# Patient Record
Sex: Female | Born: 1970 | Race: Black or African American | Hispanic: No | Marital: Married | Smoking: Never smoker
Health system: Southern US, Community
[De-identification: ages and names within clinical notes are randomized; demographics above are authoritative.]

## PROBLEM LIST (undated history)

## (undated) DIAGNOSIS — D649 Anemia, unspecified: Secondary | ICD-10-CM

## (undated) DIAGNOSIS — Z9889 Other specified postprocedural states: Secondary | ICD-10-CM

## (undated) DIAGNOSIS — Z800 Family history of malignant neoplasm of digestive organs: Secondary | ICD-10-CM

## (undated) HISTORY — PX: OTHER SURGICAL HISTORY: SHX169

## (undated) HISTORY — DX: Family history of malignant neoplasm of digestive organs: Z80.0

## (undated) HISTORY — DX: Anemia, unspecified: D64.9

## (undated) HISTORY — DX: Other specified postprocedural states: Z98.890

---

## 1995-04-03 HISTORY — PX: APPENDECTOMY: SHX54

## 1997-04-02 HISTORY — PX: EXPLORATORY LAPAROTOMY: SUR591

## 2006-09-28 ENCOUNTER — Emergency Department (HOSPITAL_COMMUNITY): Admission: EM | Admit: 2006-09-28 | Discharge: 2006-09-28 | Payer: Self-pay | Admitting: Family Medicine

## 2009-02-09 ENCOUNTER — Ambulatory Visit: Payer: Self-pay | Admitting: Family Medicine

## 2009-02-09 DIAGNOSIS — R030 Elevated blood-pressure reading, without diagnosis of hypertension: Secondary | ICD-10-CM | POA: Insufficient documentation

## 2009-02-10 DIAGNOSIS — E785 Hyperlipidemia, unspecified: Secondary | ICD-10-CM | POA: Insufficient documentation

## 2009-02-10 LAB — CONVERTED CEMR LAB
ALT: 11 units/L (ref 0–35)
AST: 19 units/L (ref 0–37)
Albumin: 4.2 g/dL (ref 3.5–5.2)
Alkaline Phosphatase: 66 units/L (ref 39–117)
BUN: 14 mg/dL (ref 6–23)
CO2: 22 meq/L (ref 19–32)
Calcium: 9.1 mg/dL (ref 8.4–10.5)
Chloride: 104 meq/L (ref 96–112)
Cholesterol, target level: 200 mg/dL
Cholesterol: 227 mg/dL — ABNORMAL HIGH (ref 0–200)
Creatinine, Ser: 0.81 mg/dL (ref 0.40–1.20)
Glucose, Bld: 82 mg/dL (ref 70–99)
HDL goal, serum: 40 mg/dL
HDL: 51 mg/dL (ref >39)
LDL Cholesterol: 162 mg/dL — ABNORMAL HIGH (ref 0–99)
LDL Goal: 160 mg/dL
Potassium: 3.9 meq/L (ref 3.5–5.3)
Sodium: 137 meq/L (ref 135–145)
TSH: 0.781 microintl units/mL (ref 0.350–4.500)
Total Bilirubin: 0.5 mg/dL (ref 0.3–1.2)
Total CHOL/HDL Ratio: 4.5
Total Protein: 7.7 g/dL (ref 6.0–8.3)
Triglycerides: 71 mg/dL (ref <150)
VLDL: 14 mg/dL (ref 0–40)

## 2009-02-15 ENCOUNTER — Encounter: Payer: Self-pay | Admitting: Family Medicine

## 2009-03-01 ENCOUNTER — Encounter: Payer: Self-pay | Admitting: Family Medicine

## 2009-03-01 DIAGNOSIS — Z9889 Other specified postprocedural states: Secondary | ICD-10-CM

## 2009-03-01 HISTORY — DX: Other specified postprocedural states: Z98.890

## 2009-04-12 ENCOUNTER — Encounter: Payer: Self-pay | Admitting: Family Medicine

## 2009-04-15 ENCOUNTER — Encounter: Payer: Self-pay | Admitting: Family Medicine

## 2009-04-15 DIAGNOSIS — K760 Fatty (change of) liver, not elsewhere classified: Secondary | ICD-10-CM | POA: Insufficient documentation

## 2009-04-18 ENCOUNTER — Encounter: Payer: Self-pay | Admitting: Family Medicine

## 2009-04-19 ENCOUNTER — Telehealth: Payer: Self-pay | Admitting: Family Medicine

## 2010-04-02 HISTORY — PX: COLONOSCOPY: SHX174

## 2010-05-03 NOTE — Consult Note (Signed)
Summary: [facility]  [facility]   Imported By: Lanelle Bal 02/22/2009 10:54:10  _____________________________________________________________________  External Attachment:    Type:   Image     Comment:   External Document

## 2010-05-03 NOTE — Letter (Signed)
Summary: Letter to Patient Regarding Lab Results/[facility]  Letter to Patient Regarding Lab Results/[facility]   Imported By: Lanelle Bal 05/05/2009 10:50:35  _____________________________________________________________________  External Attachment:    Type:   Image     Comment:   External Document

## 2010-05-03 NOTE — Miscellaneous (Signed)
Summary: Vaccine Record/[facility]  Vaccine Record/[facility]   Imported By: Lanelle Bal 04/15/2009 08:04:25  _____________________________________________________________________  External Attachment:    Type:   Image     Comment:   External Document

## 2010-05-03 NOTE — Letter (Signed)
Summary: [facility] 06-23-99 [facility]  [facility] 06-23-99 [facility]   Imported By: Lanelle Bal 04/15/2009 08:08:44  _____________________________________________________________________  External Attachment:    Type:   Image     Comment:   External Document

## 2010-05-03 NOTE — Consult Note (Signed)
Summary: [facility]  [facility]   Imported By: Lanelle Bal 04/21/2009 11:44:14  _____________________________________________________________________  External Attachment:    Type:   Image     Comment:   External Document

## 2010-05-03 NOTE — Letter (Signed)
Summary: Letter to Patient Regarding Abdominal US & Xray Results/[facility]  Letter to Patient Regarding Abdominal US & Xray Results/[facility]   Imported By: Lanelle Bal 04/26/2009 10:33:27  _____________________________________________________________________  External Attachment:    Type:   Image     Comment:   External Document

## 2010-05-03 NOTE — Miscellaneous (Signed)
Summary: Added fatty liver to prob list  Clinical Lists Changes  Problems: Removed problem of ABDOMINAL PAIN OTHER SPECIFIED SITE (ICD-789.09) Removed problem of HEALTH MAINTENANCE EXAM (ICD-V70.0) Added new problem of FATTY LIVER DISEASE (ICD-571.8)

## 2010-05-03 NOTE — Progress Notes (Signed)
Summary: Test results from another MD  Phone Note Call from Patient Call back at [phone]   Summary of Call: Patient called requesting test results. I see that we did receive her colon report which was normal and to repeat in 5 years. Per Dr. Linford Arnold it is ok to notify pt of the previous, but if she has questions she would have to call their office.  I will call patient to see if this is the results that she is referring to. Initial call taken by: Lucious Groves,  April 19, 2009 4:45 PM  Follow-up for Phone Call        Left message on voicemail to call back to office. Follow-up by: Lucious Groves,  April 19, 2009 4:45 PM

## 2011-08-02 ENCOUNTER — Encounter: Payer: Self-pay | Admitting: Family Medicine

## 2011-08-02 ENCOUNTER — Ambulatory Visit (INDEPENDENT_AMBULATORY_CARE_PROVIDER_SITE_OTHER): Payer: Federal, State, Local not specified - PPO | Admitting: Family Medicine

## 2011-08-02 VITALS — BP 123/84 | HR 80 | Wt 217.0 lb

## 2011-08-02 DIAGNOSIS — R5381 Other malaise: Secondary | ICD-10-CM

## 2011-08-02 DIAGNOSIS — Z23 Encounter for immunization: Secondary | ICD-10-CM

## 2011-08-02 DIAGNOSIS — R5383 Other fatigue: Secondary | ICD-10-CM

## 2011-08-02 DIAGNOSIS — Z0000 Encounter for general adult medical examination without abnormal findings: Secondary | ICD-10-CM

## 2011-08-02 DIAGNOSIS — N926 Irregular menstruation, unspecified: Secondary | ICD-10-CM

## 2011-08-02 MED ORDER — KETOCONAZOLE 2 % EX SHAM: MEDICATED_SHAMPOO | CUTANEOUS | Status: AC

## 2011-08-02 NOTE — Progress Notes (Signed)
Subjective:     Michele Roman is a 41 y.o. female and is here for a comprehensive physical exam. The patient reports no problems.  Having irrgular period for the past year.  Has been really tired the last few weeks. Says on light duty for a workers comp injury.  Says when gets up she feels really nauseted and want to lay down.  Says no GERD. No UTI sxs or fevers.    History   Social History  . Marital Status: Married    Spouse Name: N/A    Number of Children: 1  . Years of Education: N/A   Occupational History  . Not on file.   Social History Main Topics  . Smoking status: Never Smoker   . Smokeless tobacco: Not on file  . Alcohol Use: Yes     Rarely   . Drug Use: Not on file  . Sexually Active: Yes -- Female partner(s)   Other Topics Concern  . Not on file   Social History Narrative  . No narrative on file   Health Maintenance  Topic Date Due  . Pap Smear  11/16/1989  . Influenza Vaccine  01/01/2012  . Tetanus/tdap  12/22/2015    The following portions of the patient's history were reviewed and updated as appropriate: allergies, current medications, past family history, past medical history, past social history, past surgical history and problem list.  Review of Systems A comprehensive review of systems was negative.   Objective:    BP 123/84  Pulse 80  Wt 217 lb (98.431 kg) General appearance: alert, cooperative, appears stated age and mildly obese Head: Normocephalic, without obvious abnormality, atraumatic Eyes: conj clear, EOMi, PERRLA Ears: normal TM's and external ear canals both ears Nose: Nares normal. Septum midline. Mucosa normal. No drainage or sinus tenderness. Throat: lips, mucosa, and tongue normal; teeth and gums normal Neck: no adenopathy, no carotid bruit, no JVD, supple, symmetrical, trachea midline and thyroid not enlarged, symmetric, no tenderness/mass/nodules Back: symmetric, no curvature. ROM normal. No CVA tenderness. Lungs: clear to  auscultation bilaterally Heart: regular rate and rhythm, S1, S2 normal, no murmur, click, rub or gallop Abdomen: soft, non-tender; bowel sounds normal; no masses,  no organomegaly Extremities: extremities normal, atraumatic, no cyanosis or edema Pulses: 2+ and symmetric Skin: Skin color, texture, turgor normal. No rashes or lesions or tato, hypopigmented areas on her back.  Lymph nodes: Cervical, supraclavicular, and axillary nodes normal. Neurologic: Alert and oriented X 3, normal strength and tone. Normal symmetric reflexes. Normal coordination and gait    Assessment:    Healthy female exam.      Plan:     See After Visit Summary for Counseling Recommendations  Start a regular exercise program and make sure you are eating a healthy diet Try to eat 4 servings of dairy a day or take a calcium supplement (500mg  twice a day). Your vaccines are up to date.  We will call you with your lab results. If you don't here from [place] in about a week then please give [place] a call at [phone].  Fatigue we will check a CBC, TSH, vitamin D.  Irregular periods-likely related to her increase in stress recently we will check her hormones to make sure they're normal. She denies that anyone in her family have had premature menopause.  Tinea Versicolor - she tried using [employer] but then started to irritate her skin for that. We will change to ketoconazole shampoo and have her apply overnight and  wash off in the morning. If this is not helping her working and please let me know.  She wants to get vaccinated against hepatitis. First Twinrix injection given today. Repeat at one and 6 months.

## 2011-08-02 NOTE — Patient Instructions (Signed)
Start a regular exercise program and make sure you are eating a healthy diet Try to eat 4 servings of dairy a day or take a calcium supplement (500mg twice a day). Your vaccines are up to date.   

## 2011-08-03 LAB — COMPLETE METABOLIC PANEL WITH GFR
ALT: 16 U/L (ref 0–35)
AST: 20 U/L (ref 0–37)
Albumin: 3.9 g/dL (ref 3.5–5.2)
Alkaline Phosphatase: 53 U/L (ref 39–117)
BUN: 10 mg/dL (ref 6–23)
CO2: 22 mEq/L (ref 19–32)
Calcium: 9 mg/dL (ref 8.4–10.5)
Chloride: 105 mEq/L (ref 96–112)
Creat: 0.79 mg/dL (ref 0.50–1.10)
GFR, Est African American: >89 mL/min
GFR, Est Non African American: >89 mL/min
Glucose, Bld: 91 mg/dL (ref 70–99)
Potassium: 4.1 mEq/L (ref 3.5–5.3)
Sodium: 137 mEq/L (ref 135–145)
Total Bilirubin: 0.4 mg/dL (ref 0.3–1.2)
Total Protein: 6.9 g/dL (ref 6.0–8.3)

## 2011-08-03 LAB — CBC WITH DIFFERENTIAL/PLATELET
Basophils Absolute: 0 10*3/uL (ref 0.0–0.1)
Basophils Relative: 0 % (ref 0–1)
Eosinophils Absolute: 0 10*3/uL (ref 0.0–0.7)
Eosinophils Relative: 1 % (ref 0–5)
HCT: 36.6 % (ref 36.0–46.0)
Hemoglobin: 11.4 g/dL — ABNORMAL LOW (ref 12.0–15.0)
Lymphocytes Relative: 31 % (ref 12–46)
Lymphs Abs: 1.9 10*3/uL (ref 0.7–4.0)
MCH: 21.7 pg — ABNORMAL LOW (ref 26.0–34.0)
MCHC: 31.1 g/dL (ref 30.0–36.0)
MCV: 69.7 fL — ABNORMAL LOW (ref 78.0–100.0)
Monocytes Absolute: 0.4 10*3/uL (ref 0.1–1.0)
Monocytes Relative: 6 % (ref 3–12)
Neutro Abs: 3.8 10*3/uL (ref 1.7–7.7)
Neutrophils Relative %: 62 % (ref 43–77)
Platelets: 241 10*3/uL (ref 150–400)
RBC: 5.25 MIL/uL — ABNORMAL HIGH (ref 3.87–5.11)
RDW: 18 % — ABNORMAL HIGH (ref 11.5–15.5)
WBC: 6.2 10*3/uL (ref 4.0–10.5)

## 2011-08-03 LAB — LIPID PANEL
Cholesterol: 205 mg/dL — ABNORMAL HIGH (ref 0–200)
HDL: 47 mg/dL (ref >39)
LDL Cholesterol: 140 mg/dL — ABNORMAL HIGH (ref 0–99)
Total CHOL/HDL Ratio: 4.4 Ratio
Triglycerides: 88 mg/dL (ref <150)
VLDL: 18 mg/dL (ref 0–40)

## 2011-08-03 LAB — LUTEINIZING HORMONE: LH: <0.1 m[IU]/mL

## 2011-08-03 LAB — TSH: TSH: 1.082 u[IU]/mL (ref 0.350–4.500)

## 2011-08-03 LAB — PROGESTERONE: Progesterone: 10.9 ng/mL

## 2011-08-03 LAB — ESTRADIOL: Estradiol: 594.2 pg/mL

## 2011-08-03 LAB — FOLLICLE STIMULATING HORMONE: FSH: <0.3 m[IU]/mL

## 2011-08-04 LAB — VITAMIN D 25 HYDROXY (VIT D DEFICIENCY, FRACTURES): Vit D, 25-Hydroxy: 42 ng/mL (ref 30–89)

## 2011-08-07 ENCOUNTER — Ambulatory Visit (INDEPENDENT_AMBULATORY_CARE_PROVIDER_SITE_OTHER): Payer: Federal, State, Local not specified - PPO | Admitting: Family Medicine

## 2011-08-07 VITALS — BP 125/86 | HR 86

## 2011-08-07 DIAGNOSIS — Z331 Pregnant state, incidental: Secondary | ICD-10-CM

## 2011-08-07 DIAGNOSIS — Z205 Contact with and (suspected) exposure to viral hepatitis: Secondary | ICD-10-CM

## 2011-08-07 DIAGNOSIS — N912 Amenorrhea, unspecified: Secondary | ICD-10-CM

## 2011-08-07 DIAGNOSIS — R110 Nausea: Secondary | ICD-10-CM

## 2011-08-07 LAB — POCT URINE PREGNANCY: Preg Test, Ur: POSITIVE

## 2011-08-07 MED ORDER — PRENATAL VITAMINS PLUS 27-1 MG PO TABS: 1.0000 | ORAL_TABLET | Freq: Every day | ORAL | Status: DC

## 2011-08-07 NOTE — Progress Notes (Signed)
  Subjective:    Patient ID: Michele Roman, female    DOB: 1970-05-04, 41 y.o.   MRN: [id number] UPT- call Cell number for any reason per pt request. HPI    Review of Systems     Objective:   Physical Exam        Assessment & Plan:  Hormones were abnormal on blood work. Asked to come in today for urine pregnancy test. It was positive. She was surprised by the results. She said she did have a period in mid February, she doesn't know the exact date. She did spot for one day in the middle of March but wasn't sure if it was really a fall.. She does history of irregular periods. We will need to start her on prenatal vitamins I sent a prescription to her pharmacy. We'll also need to get her in with an OB/GYN. Check [facility] has an appointment scheduled for May 21 about 2 weeks. Asked her to call them so that they can be aware that she is now pregnant. We'll go ahead and schedule her for a OB ultrasound less than 14 weeks to see if we can date the pregnancy since her dates may be off as much as 4 weeks. She is either about 7-1/[redacted] weeks pregnant or 11-1/[redacted] weeks pregnant.  She is also concerned because she had had to enlarge and could possibly be pregnant at that time. We will check an acute hepatitis panel. She also received her first Twinrix injection last week. I will need to check on the safety indication for pregnancy for that. Studies show no adverse fetal effects in animals but there've been no controlled human studies. We will hold off on continuing the vaccination during her pregnancy.  Cipriano Bunker, MD

## 2011-08-08 LAB — HEPATITIS PANEL, ACUTE
HCV Ab: NEGATIVE
Hep A IgM: NEGATIVE
Hep B C IgM: NEGATIVE
Hepatitis B Surface Ag: NEGATIVE

## 2011-08-10 ENCOUNTER — Encounter (HOSPITAL_COMMUNITY): Payer: Self-pay

## 2011-08-10 ENCOUNTER — Ambulatory Visit (HOSPITAL_COMMUNITY)
Admission: RE | Admit: 2011-08-10 | Discharge: 2011-08-10 | Disposition: A | Discharge status: Home / Self Care | Payer: Federal, State, Local not specified - PPO | Source: Ambulatory Visit | Attending: Family Medicine | Admitting: Family Medicine

## 2011-08-10 ENCOUNTER — Other Ambulatory Visit: Payer: Self-pay | Admitting: Family Medicine

## 2011-08-10 DIAGNOSIS — Z331 Pregnant state, incidental: Secondary | ICD-10-CM

## 2011-08-10 DIAGNOSIS — Z3689 Encounter for other specified antenatal screening: Secondary | ICD-10-CM | POA: Insufficient documentation

## 2011-08-10 DIAGNOSIS — O09529 Supervision of elderly multigravida, unspecified trimester: Secondary | ICD-10-CM | POA: Insufficient documentation

## 2011-08-10 DIAGNOSIS — O34219 Maternal care for unspecified type scar from previous cesarean delivery: Secondary | ICD-10-CM | POA: Insufficient documentation

## 2012-11-03 ENCOUNTER — Encounter: Payer: Federal, State, Local not specified - PPO | Admitting: Family Medicine

## 2012-11-12 ENCOUNTER — Ambulatory Visit (INDEPENDENT_AMBULATORY_CARE_PROVIDER_SITE_OTHER): Payer: Federal, State, Local not specified - PPO | Admitting: Family Medicine

## 2012-11-12 ENCOUNTER — Encounter: Payer: Self-pay | Admitting: Family Medicine

## 2012-11-12 VITALS — BP 131/88 | HR 71 | Ht 64.0 in | Wt 220.0 lb

## 2012-11-12 DIAGNOSIS — R5381 Other malaise: Secondary | ICD-10-CM

## 2012-11-12 DIAGNOSIS — R5383 Other fatigue: Secondary | ICD-10-CM

## 2012-11-12 DIAGNOSIS — Z0000 Encounter for general adult medical examination without abnormal findings: Secondary | ICD-10-CM

## 2012-11-12 DIAGNOSIS — Z23 Encounter for immunization: Secondary | ICD-10-CM

## 2012-11-12 LAB — LIPID PANEL
Cholesterol: 213 mg/dL — ABNORMAL HIGH (ref 0–200)
HDL: 44 mg/dL (ref >39)
LDL Cholesterol: 151 mg/dL — ABNORMAL HIGH (ref 0–99)
Total CHOL/HDL Ratio: 4.8 Ratio
Triglycerides: 90 mg/dL (ref <150)
VLDL: 18 mg/dL (ref 0–40)

## 2012-11-12 LAB — COMPLETE METABOLIC PANEL WITH GFR
ALT: 12 U/L (ref 0–35)
AST: 19 U/L (ref 0–37)
Albumin: 4.1 g/dL (ref 3.5–5.2)
Alkaline Phosphatase: 78 U/L (ref 39–117)
BUN: 12 mg/dL (ref 6–23)
CO2: 22 mEq/L (ref 19–32)
Calcium: 9.3 mg/dL (ref 8.4–10.5)
Chloride: 105 mEq/L (ref 96–112)
Creat: 0.81 mg/dL (ref 0.50–1.10)
GFR, Est African American: >89 mL/min
GFR, Est Non African American: >89 mL/min
Glucose, Bld: 83 mg/dL (ref 70–99)
Potassium: 3.8 mEq/L (ref 3.5–5.3)
Sodium: 138 mEq/L (ref 135–145)
Total Bilirubin: 0.4 mg/dL (ref 0.3–1.2)
Total Protein: 7.7 g/dL (ref 6.0–8.3)

## 2012-11-12 LAB — CBC
HCT: 38.8 % (ref 36.0–46.0)
Hemoglobin: 13.1 g/dL (ref 12.0–15.0)
MCH: 23.4 pg — ABNORMAL LOW (ref 26.0–34.0)
MCHC: 33.8 g/dL (ref 30.0–36.0)
MCV: 69.2 fL — ABNORMAL LOW (ref 78.0–100.0)
Platelets: 262 10*3/uL (ref 150–400)
RBC: 5.61 MIL/uL — ABNORMAL HIGH (ref 3.87–5.11)
RDW: 16 % — ABNORMAL HIGH (ref 11.5–15.5)
WBC: 7.6 10*3/uL (ref 4.0–10.5)

## 2012-11-12 LAB — TSH: TSH: 0.799 u[IU]/mL (ref 0.350–4.500)

## 2012-11-12 LAB — FERRITIN: Ferritin: 8 ng/mL — ABNORMAL LOW (ref 10–291)

## 2012-11-12 LAB — VITAMIN B12: Vitamin B-12: 686 pg/mL (ref 211–911)

## 2012-11-12 LAB — FOLATE: Folate: >20 ng/mL

## 2012-11-12 MED ORDER — PRENATAL VITAMINS PLUS 27-1 MG PO TABS: 1.0000 | ORAL_TABLET | Freq: Every day | ORAL | Status: DC

## 2012-11-12 NOTE — Patient Instructions (Addendum)
Keep up a regular exercise program and make sure you are eating a healthy diet Try to eat 4 servings of dairy a day, or if you are lactose intolerant take a calcium with vitamin D daily.  Your vaccines are up to date.   

## 2012-11-12 NOTE — Progress Notes (Signed)
Called pt's ob/gyn Colette Ribas [phone] and spoke with Burna Mortimer she will fax over pt's last OV notes.Loralee Pacas [place]

## 2012-11-12 NOTE — Progress Notes (Signed)
Subjective:     Michele Roman is a 42 y.o. female and is here for a comprehensive physical exam. The patient reports no problems.  This goes to OB/GYN for her Pap smears and mammogram. She has felt more fatigued for the last 4 months and stopping her prenatal vitamin. She had her baby back in December and everything went well. She is frustrated with her weight and notices that her blood pressures up a little bit for her today.  History   Social History  . Marital Status: Married    Spouse Name: Matt    Number of Children: 1  . Years of Education: N/A   Occupational History  . Mailhandler     [employer]   Social History Main Topics  . Smoking status: Never Smoker   . Smokeless tobacco: Not on file  . Alcohol Use: Yes     Comment: Rarely   . Drug Use: Not on file  . Sexual Activity: Yes    Partners: Male   Other Topics Concern  . 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    Health Maintenance  Topic Date Due  . Pap Smear  11/16/1989  . Influenza Vaccine  12/01/2012  . Tetanus/tdap  12/22/2015    The following portions of the patient's history were reviewed and updated as appropriate: allergies, current medications, past family history, past medical history, past social history, past surgical history and problem list.  Review of Systems A comprehensive review of systems was negative.   Objective:    BP 131/88  Pulse 71  Ht 5\' 4"  (1.626 m)  Wt 220 lb (99.791 kg)  BMI 37.74 kg/m2  LMP 10/29/2012  Breastfeeding? Unknown General appearance: alert, cooperative and appears stated age Head: Normocephalic, without obvious abnormality, atraumatic Eyes: conj clear, EOMi, PEERLA Ears: normal TM's and external ear canals both ears Nose: Nares normal. Septum midline. Mucosa normal. No drainage or sinus tenderness. Throat: lips, mucosa, and tongue normal; teeth and gums normal Neck: no adenopathy, no carotid bruit, no JVD, supple,  symmetrical, trachea midline and thyroid not enlarged, symmetric, no tenderness/mass/nodules Back: symmetric, no curvature. ROM normal. No CVA tenderness. Lungs: clear to auscultation bilaterally Heart: regular rate and rhythm, S1, S2 normal, no murmur, click, rub or gallop Abdomen: soft, non-tender; bowel sounds normal; no masses,  no organomegaly Extremities: extremities normal, atraumatic, no cyanosis or edema Pulses: 2+ and symmetric Skin: Skin color, texture, turgor normal. No rashes or lesions Lymph nodes: Cervical and supraclavicular nodes are normal Neurologic: Alert and oriented X 3, normal strength and tone. Normal symmetric reflexes. Normal coordination and gait    Assessment:    Healthy female exam.      Plan:     Keep up a regular exercise program and make sure you are eating a healthy diet Try to eat 4 servings of dairy a day, or if you are lactose intolerant take a calcium with vitamin D daily.  Your vaccines are up to date.   See After Visit Summary for Counseling Recommendations  Keep up a regular exercise program and make sure you are eating a healthy diet Try to eat 4 servings of dairy a day, or if you are lactose intolerant take a calcium with vitamin D daily.  Your vaccines are up to date.   History of abnormal Pap smear. Has followup in about a month with her OB/GYN. Encouraged her to discuss whether not she  needs a mammogram with her OB/GYN since she is now 42 years old.  Fatigue-she says exertion she stopped her prenatal vitamin about 4 months ago she has felt much more fatigued than usual. She says occasionally her periods are heavy but not consistently heavy. We'll check a thyroid level since this is certainly a possibility postpartum and check a CBC, ferritin, folate, B12 for possible causes of anemia.

## 2012-11-13 ENCOUNTER — Encounter: Payer: Self-pay | Admitting: Family Medicine

## 2012-11-13 DIAGNOSIS — IMO0002 Reserved for concepts with insufficient information to code with codable children: Secondary | ICD-10-CM | POA: Insufficient documentation

## 2012-11-13 LAB — VITAMIN D 25 HYDROXY (VIT D DEFICIENCY, FRACTURES): Vit D, 25-Hydroxy: 42 ng/mL (ref 30–89)

## 2013-01-14 ENCOUNTER — Ambulatory Visit (INDEPENDENT_AMBULATORY_CARE_PROVIDER_SITE_OTHER): Payer: Federal, State, Local not specified - PPO | Admitting: Family Medicine

## 2013-01-14 ENCOUNTER — Encounter: Payer: Self-pay | Admitting: Family Medicine

## 2013-01-14 VITALS — BP 131/87 | HR 67 | Wt 226.0 lb

## 2013-01-14 DIAGNOSIS — E669 Obesity, unspecified: Secondary | ICD-10-CM

## 2013-01-14 DIAGNOSIS — D509 Iron deficiency anemia, unspecified: Secondary | ICD-10-CM

## 2013-01-14 LAB — FERRITIN: Ferritin: 14 ng/mL (ref 10–291)

## 2013-01-14 LAB — RETICULOCYTES
ABS Retic: 101.5 10*3/uL (ref 19.0–186.0)
RBC.: 5.64 MIL/uL — ABNORMAL HIGH (ref 3.87–5.11)
Retic Ct Pct: 1.8 % (ref 0.4–2.3)

## 2013-01-14 LAB — HEMOGLOBIN: Hemoglobin: 13.2 g/dL (ref 12.0–15.0)

## 2013-01-14 NOTE — Patient Instructions (Signed)
Return in about 5 months (around 04/14/2013) for 3rd twinrix injection w/ nurse.

## 2013-01-14 NOTE — Progress Notes (Signed)
  Subjective:    Patient ID: Michele Roman, female    DOB: [birth year], 42 y.o.   MRN: [id number]  HPI Here for followup on iron deficiency. She was here for a routine physical in August, approximately 2 months ago. It was noted that she had low iron stores and recommended starting on over-the-counter iron supplement. Back on PNV. No constipation. Not using the orange juice. She feels better overall.  Energy is better overall. Sleep is ok. Joined a gym about a month ago but had to stop bc was getting shin pain. She sees [facility]  Obesity - has gained 6 lbs in the last 2 months. She is really struggling. Often skips meals in the AM  Hasn't been abel to exercise as above. She would like to discuss some strategies for weight loss.  Review of Systems     Objective:   Physical Exam  Constitutional: She is oriented to person, place, and time. She appears well-developed and well-nourished.  HENT:  Head: Normocephalic and atraumatic.  Neurological: She is alert and oriented to person, place, and time.  Psychiatric: She has a normal mood and affect.          Assessment & Plan:  Fatigue - improving.  We'll recheck ferritin, reticulocyte count, hemoglobin today. She's having a good response. I did encourage her to try taking a prenatal vitamin with a couple ounces of orange juice for improved absorption of the iron content.  Obesity/BMI 38-discussed strategies for weight loss. I recommended the Smart phone application call my fitness PAL. She often skips breakfast and we discussed making sure that she get some type of breakfast even if it's a little more carb heavy. Raeanne Gathers also great source of protein for breakfast.Also discussed weight loss medications - pros and cons. I strong encouraged her to followup with her orthopedist as well for her foot and leg pain. It's important to get her back into exercise for her to be successful at weight loss.

## 2013-01-15 ENCOUNTER — Other Ambulatory Visit: Payer: Self-pay | Admitting: *Deleted

## 2013-01-15 DIAGNOSIS — D649 Anemia, unspecified: Secondary | ICD-10-CM

## 2013-06-25 ENCOUNTER — Encounter: Payer: Self-pay | Admitting: Family Medicine

## 2013-06-25 ENCOUNTER — Ambulatory Visit (INDEPENDENT_AMBULATORY_CARE_PROVIDER_SITE_OTHER): Payer: 59 | Admitting: Family Medicine

## 2013-06-25 VITALS — BP 114/79 | HR 67 | Wt 217.0 lb

## 2013-06-25 DIAGNOSIS — Z23 Encounter for immunization: Secondary | ICD-10-CM

## 2013-06-25 DIAGNOSIS — D509 Iron deficiency anemia, unspecified: Secondary | ICD-10-CM

## 2013-06-25 DIAGNOSIS — Z0000 Encounter for general adult medical examination without abnormal findings: Secondary | ICD-10-CM

## 2013-06-25 LAB — CBC
HCT: 39.1 % (ref 36.0–46.0)
Hemoglobin: 13.1 g/dL (ref 12.0–15.0)
MCH: 23.8 pg — ABNORMAL LOW (ref 26.0–34.0)
MCHC: 33.5 g/dL (ref 30.0–36.0)
MCV: 71 fL — ABNORMAL LOW (ref 78.0–100.0)
Platelets: 266 10*3/uL (ref 150–400)
RBC: 5.51 MIL/uL — ABNORMAL HIGH (ref 3.87–5.11)
RDW: 15.7 % — ABNORMAL HIGH (ref 11.5–15.5)
WBC: 6.7 10*3/uL (ref 4.0–10.5)

## 2013-06-25 LAB — COMPLETE METABOLIC PANEL WITH GFR
ALT: 11 U/L (ref 0–35)
AST: 17 U/L (ref 0–37)
Albumin: 3.9 g/dL (ref 3.5–5.2)
Alkaline Phosphatase: 62 U/L (ref 39–117)
BUN: 16 mg/dL (ref 6–23)
CO2: 22 mEq/L (ref 19–32)
Calcium: 9.2 mg/dL (ref 8.4–10.5)
Chloride: 107 mEq/L (ref 96–112)
Creat: 0.82 mg/dL (ref 0.50–1.10)
GFR, Est African American: >89 mL/min
GFR, Est Non African American: 89 mL/min
Glucose, Bld: 86 mg/dL (ref 70–99)
Potassium: 4 mEq/L (ref 3.5–5.3)
Sodium: 138 mEq/L (ref 135–145)
Total Bilirubin: 0.4 mg/dL (ref 0.2–1.2)
Total Protein: 7.2 g/dL (ref 6.0–8.3)

## 2013-06-25 LAB — LIPID PANEL
Cholesterol: 178 mg/dL (ref 0–200)
HDL: 37 mg/dL — ABNORMAL LOW (ref >39)
LDL Cholesterol: 129 mg/dL — ABNORMAL HIGH (ref 0–99)
Total CHOL/HDL Ratio: 4.8 Ratio
Triglycerides: 61 mg/dL (ref <150)
VLDL: 12 mg/dL (ref 0–40)

## 2013-06-25 LAB — FERRITIN: Ferritin: 14 ng/mL (ref 10–291)

## 2013-06-25 MED ORDER — KETOCONAZOLE 2 % EX CREA: 1.0000 "application " | TOPICAL_CREAM | Freq: Every day | CUTANEOUS | Status: DC

## 2013-06-25 NOTE — Patient Instructions (Signed)
Keep up a regular exercise program and make sure you are eating a healthy diet Try to eat 4 servings of dairy a day, or if you are lactose intolerant take a calcium with vitamin D daily.  Your vaccines are up to date.   

## 2013-06-25 NOTE — Progress Notes (Signed)
  Subjective:     Michele Roman is a 43 y.o. female and is here for a comprehensive physical exam. The patient reports problems - noticed pigmentation chage around neck. Thinks sweating triggers it. Doesn't really bother her.  History   Social History  . Marital Status: Married    Spouse Name: Matt    Number of Children: 1  . Years of Education: N/A   Occupational History  . [place] History Main Topics  . Smoking status: Never Smoker   . Smokeless tobacco: Not on file  . Alcohol Use: Yes     Comment: Rarely   . Drug Use: Not on file  . Sexual Activity: Yes    Partners: Male   Other Topics Concern  . 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    Health Maintenance  Topic Date Due  . Influenza Vaccine  04/01/2014 (Originally 10/31/2012)  . Pap Smear  07/02/2015  . Tetanus/tdap  12/22/2015    The following portions of the patient's history were reviewed and updated as appropriate: allergies, current medications, past family history, past medical history, past social history, past surgical history and problem list.  Review of Systems A comprehensive review of systems was negative.   Objective:    BP 114/79  Pulse 67  Wt 217 lb (98.431 kg)  SpO2 98% General appearance: alert, cooperative and appears stated age Head: Normocephalic, without obvious abnormality, atraumatic Eyes: conj clear, EOMI, PEERLA Ears: normal TM's and external ear canals both ears Nose: Nares normal. Septum midline. Mucosa normal. No drainage or sinus tenderness. Throat: lips, mucosa, and tongue normal; teeth and gums normal Neck: no adenopathy, no carotid bruit, no JVD, supple, symmetrical, trachea midline and thyroid not enlarged, symmetric, no tenderness/mass/nodules Back: symmetric, no curvature. ROM normal. No CVA tenderness. Lungs: clear to auscultation bilaterally Heart: regular rate and rhythm, S1, S2 normal, no murmur,  click, rub or gallop Abdomen: soft, non-tender; bowel sounds normal; no masses,  no organomegaly Extremities: extremities normal, atraumatic, no cyanosis or edema Pulses: 2+ and symmetric Skin: Macular hypopigmented rash around the neck. It is circular formation. Lymph nodes: Cervical, supraclavicular, and axillary nodes normal. Neurologic: Alert and oriented X 3, normal strength and tone. Normal symmetric reflexes. Normal coordination and gait    Assessment:    Healthy female exam.     Plan:     See After Visit Summary for Counseling Recommendations  Keep up a regular exercise program and make sure you are eating a healthy diet Try to eat 4 servings of dairy a day, or if you are lactose intolerant take a calcium with vitamin D daily.  Your vaccines are up to date.   Tinea versicolor - we'll treat with topical ketoconazole cream. She will need to treat for least 2 weeks. It may take a little longer for the pigmentation to return.

## 2013-06-25 NOTE — Addendum Note (Signed)
Addended by: Teddy Spike on: 06/25/2013 10:05 AM   Modules accepted: Orders

## 2013-10-08 ENCOUNTER — Ambulatory Visit (INDEPENDENT_AMBULATORY_CARE_PROVIDER_SITE_OTHER): Payer: 59 | Admitting: Family Medicine

## 2013-10-08 ENCOUNTER — Encounter: Payer: Self-pay | Admitting: Family Medicine

## 2013-10-08 VITALS — BP 125/94 | HR 85 | Temp 98.6°F | Wt 217.0 lb

## 2013-10-08 DIAGNOSIS — J309 Allergic rhinitis, unspecified: Secondary | ICD-10-CM

## 2013-10-08 MED ORDER — BECLOMETHASONE DIPROPIONATE 80 MCG/ACT NA AERS: INHALATION_SPRAY | NASAL | Status: DC

## 2013-10-08 NOTE — Progress Notes (Signed)
CC: Michele Roman is a 43 y.o. female is here for Sore Throat   Subjective: HPI:  Complains of nasal congestion, sore throat, drainage down the back of her throat has been present on a daily basis since February. Symptoms fluctuate from mild to moderate severity on a day-to-day basis. Symptoms are worse when she is in dusty environments such as at her work. Symptoms are improved with Mucinex, nothing else has seemed to make better or worse and there been no other interventions. She denies sneezing, facial pain, headache, fevers, chills, chest pain, shortness of breath, wheezing or cough   Review Of Systems Outlined In HPI  Past Medical History  Diagnosis Date  . H/O colonoscopy 03/01/09    Past Surgical History  Procedure Laterality Date  . Cesarean section  1999  . Appendectomy  1997  . Exploratory laparotomy  1999  . Right foot surgery  2008, 2011    hardware in the foot   Family History  Problem Relation Age of Onset  . Hypertension Mother     History   Social History  . Marital Status: Married    Spouse Name: Matt    Number of Children: 1  . Years of Education: N/A   Occupational History  . [place] History Main Topics  . Smoking status: Never Smoker   . Smokeless tobacco: Not on file  . Alcohol Use: Yes     Comment: Rarely   . Drug Use: Not on file  . Sexual Activity: Yes    Partners: Male   Other Topics Concern  . 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      Objective: BP 125/94  Pulse 85  Temp(Src) 98.6 F (37 C) (Oral)  Wt 217 lb (98.431 kg)  General: Alert and Oriented, No Acute Distress HEENT: Pupils equal, round, reactive to light. Conjunctivae clear.  External ears unremarkable, canals clear with intact TMs with appropriate landmarks.  Middle ear appears open without effusion. Pink inferior turbinates.  Moist mucous membranes, pharynx without inflammation nor lesions.  Neck  supple without palpable lymphadenopathy nor abnormal masses. Lungs: Clear to auscultation bilaterally, no wheezing/ronchi/rales.  Comfortable work of breathing. Good air movement. Cardiac: Regular rate and rhythm. Normal S1/S2.  No murmurs, rubs, nor gallops.   Mental Status: No depression, anxiety, nor agitation. Skin: Warm and dry.  Assessment & Plan: Marycruz was seen today for sore throat.  Diagnoses and associated orders for this visit:  Allergic sinusitis    Allergic sinusitis: Start qnasl, followup with PCP next week if no improvement by the end of the week, however if this controls symptoms I've advised her to consider switching to Nasonex or Flonase which is over-the-counter  Return if symptoms worsen or fail to improve.

## 2013-10-12 ENCOUNTER — Telehealth: Payer: Self-pay | Admitting: *Deleted

## 2013-10-12 ENCOUNTER — Encounter: Payer: Self-pay | Admitting: Emergency Medicine

## 2013-10-12 ENCOUNTER — Emergency Department
Admission: EM | Admit: 2013-10-12 | Discharge: 2013-10-12 | Disposition: A | Discharge status: Home / Self Care | Payer: 59 | Source: Home / Self Care | Attending: Family Medicine | Admitting: Family Medicine

## 2013-10-12 DIAGNOSIS — J029 Acute pharyngitis, unspecified: Secondary | ICD-10-CM

## 2013-10-12 DIAGNOSIS — K219 Gastro-esophageal reflux disease without esophagitis: Secondary | ICD-10-CM

## 2013-10-12 DIAGNOSIS — J309 Allergic rhinitis, unspecified: Secondary | ICD-10-CM

## 2013-10-12 LAB — POCT RAPID STREP A (OFFICE): Rapid Strep A Screen: NEGATIVE

## 2013-10-12 MED ORDER — OMEPRAZOLE 20 MG PO CPDR: DELAYED_RELEASE_CAPSULE | ORAL | Status: DC

## 2013-10-12 NOTE — Discharge Instructions (Signed)
Recommend a trial of Zyrtec once daily for allergy symptoms.  Continue nasal inhaler.  If cold symptoms develop: Take plain Mucinex (1200 mg guaifenesin) twice daily for cough and congestion.  May add Sudafed for sinus congestion.   Increase fluid intake, rest. May use Afrin nasal spray (or generic oxymetazoline) twice daily for about 5 days.  Also recommend using saline nasal spray several times daily and saline nasal irrigation (AYR is a common brand) Try warm salt water gargles for sore throat.  May take Delsym Cough Suppressant at bedtime for nighttime cough.  Follow-up with family doctor if not improving 7 to 10 days.

## 2013-10-12 NOTE — ED Provider Notes (Signed)
CSN: [id number]     Arrival date & time 10/12/13  1304 History   First MD Initiated Contact with Patient 10/12/13 1339     Chief Complaint  Patient presents with  . Sore Throat  . Nasal Congestion     HPI Comments: Patient complains of intermittent sinus congestion, sore throat, hoarseness, and post-nasal drainage over the past 5 months.  Her symptoms have been worse over the past week.  She was prescribed QNasl four days ago and noted slight decrease in her nasal congestion.  However, over the past two days she has developed increased congestion, hoarseness, fatigue and cough.  Over the past five months she often awakens with phlegm in her throat, cough, and sore throat.  The symptoms improve after arising.  The history is provided by the patient.    Past Medical History  Diagnosis Date  . H/O colonoscopy 03/01/09   Past Surgical History  Procedure Laterality Date  . Cesarean section  1999  . Appendectomy  1997  . Exploratory laparotomy  1999  . Right foot surgery  2008, 2011    hardware in the foot   Family History  Problem Relation Age of Onset  . Hypertension Mother    History  Substance Use Topics  . Smoking status: Never Smoker   . Smokeless tobacco: Not on file  . Alcohol Use: Yes     Comment: Rarely    OB History   Grav Para Term Preterm Abortions TAB SAB Ect Mult Living   1              Review of Systems + sore throat + hoarse + cough No pleuritic pain No wheezing + nasal congestion + post-nasal drainage No sinus pain/pressure No itchy/red eyes No earache No hemoptysis No SOB No fever/chills No nausea No vomiting No abdominal pain No diarrhea No urinary symptoms No skin rash No fatigue No myalgias No headache Used OTC meds without relief   Allergies  Review of patient's allergies indicates no known allergies.  Home Medications   Prior to Admission medications   Medication Sig Start Date End Date Taking? Authorizing Provider   Beclomethasone Dipropionate (QNASL) 80 MCG/ACT AERS 2 puffs each nostril daily 10/08/13   Marcial Pacas, DO  Cholecalciferol (D3 ADULT PO) Take 1 tablet by mouth daily.    Historical Provider, MD  Cyanocobalamin (B-12) 100 MCG TABS Take 1 tablet by mouth daily.    Historical Provider, MD  ketoconazole (NIZORAL) 2 % cream Apply 1 application topically daily. X 2 weeks 06/25/13   Hali Marry, MD  omeprazole (PRILOSEC) 20 MG capsule Take one cap each evening 30 minutes before food. 10/12/13   Kandra Nicolas, MD  Prenatal Vit-Fe Fumarate-FA (PRENATAL VITAMINS PLUS) 27-1 MG TABS Take 1 tablet by mouth daily. 11/12/12   Hali Marry, MD   BP 120/87  Pulse 70  Temp(Src) 98 F (36.7 C) (Oral)  Resp 16  Ht 5\' 4"  (1.626 m)  Wt 217 lb (98.431 kg)  BMI 37.23 kg/m2  SpO2 98% Physical Exam Nursing notes and Vital Signs reviewed. Appearance:  Patient appears stated age, and in no acute distress.  Patient is obese (BMI 37.2) Eyes:  Pupils are equal, round, and reactive to light and accomodation.  Extraocular movement is intact.  Conjunctivae are not inflamed  Ears:  Canals normal.  Tympanic membranes normal.  Nose:   Congested turbinates.  No sinus tenderness.  Pharynx:  Normal Neck:  Supple.   Tender shotty  posterior nodes are palpated bilaterally  Lungs:  Clear to auscultation.  Breath sounds are equal.  Heart:  Regular rate and rhythm without murmurs, rubs, or gallops.  Abdomen:  Nontender without masses or hepatosplenomegaly.  Bowel sounds are present.  No CVA or flank tenderness.  Extremities:  No edema.  No calf tenderness Skin:  No rash present.   ED Course  Procedures  none    Labs Reviewed  STREP A DNA PROBE  POCT RAPID STREP A (OFFICE) negative         MDM   1. Acute pharyngitis, unspecified pharyngitis type; ?early viral URI   2. Gastroesophageal reflux disease without esophagitis   3. Allergic rhinitis, unspecified allergic rhinitis type    Begin trial of  omeprazole. Recommend a trial of Zyrtec once daily for allergy symptoms.  Continue nasal inhaler.  If cold symptoms develop: Take plain Mucinex (1200 mg guaifenesin) twice daily for cough and congestion.  May add Sudafed for sinus congestion.   Increase fluid intake, rest. May use Afrin nasal spray (or generic oxymetazoline) twice daily for about 5 days.  Also recommend using saline nasal spray several times daily and saline nasal irrigation (AYR is a common brand) Try warm salt water gargles for sore throat.  May take Delsym Cough Suppressant at bedtime for nighttime cough.  Follow-up with family doctor if not improving about 3 weeks.    Kandra Nicolas, MD 10/15/13 2142

## 2013-10-12 NOTE — ED Notes (Signed)
States has had intermittent congestion, sore throat, hoarseness for about 5 months; worsened over past 1 week and was seen by MD 4 days ago with dx post nasal drip; told to seek care if not improved with Q-nasl. No fever.

## 2013-10-12 NOTE — Telephone Encounter (Signed)
Michele Roman called and stated that her throat was still not feeling any better and she denied fever but she was very hoarse sounding and hard to understand. I advised that she go to urgent care and be seen today for a throat swab. Margette Fast, CMA

## 2013-10-13 LAB — STREP A DNA PROBE: GASP: NEGATIVE

## 2013-10-16 ENCOUNTER — Telehealth: Payer: Self-pay

## 2013-10-16 NOTE — ED Notes (Signed)
I called and spoke with patient and she is doing better. I advised to call back if anything changes or if she has questions or concerns.

## 2013-11-01 ENCOUNTER — Encounter: Payer: Self-pay | Admitting: Emergency Medicine

## 2013-11-01 ENCOUNTER — Emergency Department (INDEPENDENT_AMBULATORY_CARE_PROVIDER_SITE_OTHER)
Admission: EM | Admit: 2013-11-01 | Discharge: 2013-11-01 | Disposition: A | Discharge status: Home / Self Care | Payer: 59 | Source: Home / Self Care | Attending: Family Medicine | Admitting: Family Medicine

## 2013-11-01 ENCOUNTER — Emergency Department (INDEPENDENT_AMBULATORY_CARE_PROVIDER_SITE_OTHER): Payer: 59

## 2013-11-01 DIAGNOSIS — B9789 Other viral agents as the cause of diseases classified elsewhere: Principal | ICD-10-CM

## 2013-11-01 DIAGNOSIS — J3489 Other specified disorders of nose and nasal sinuses: Secondary | ICD-10-CM

## 2013-11-01 DIAGNOSIS — R059 Cough, unspecified: Secondary | ICD-10-CM

## 2013-11-01 DIAGNOSIS — J069 Acute upper respiratory infection, unspecified: Secondary | ICD-10-CM

## 2013-11-01 DIAGNOSIS — R05 Cough: Secondary | ICD-10-CM

## 2013-11-01 LAB — POCT RAPID STREP A (OFFICE): Rapid Strep A Screen: NEGATIVE

## 2013-11-01 MED ORDER — AZITHROMYCIN 250 MG PO TABS: ORAL_TABLET | ORAL | Status: DC

## 2013-11-01 MED ORDER — BENZONATATE 200 MG PO CAPS: 200.0000 mg | ORAL_CAPSULE | Freq: Every day | ORAL | Status: DC

## 2013-11-01 NOTE — ED Provider Notes (Signed)
CSN: [id number]     Arrival date & time 11/01/13  1106 History   First MD Initiated Contact with Patient 11/01/13 1150     Chief Complaint  Patient presents with  . URI      HPI Comments: Patient states that she has been exposed to black mold at work, but she is not sure how long.  She states that her previous respiratory symptoms resolved.  Yesterday she developed sinus congestion.  Today she had a sore throat, fatigue, and has developed a cough.  The history is provided by the patient.    Past Medical History  Diagnosis Date  . H/O colonoscopy 03/01/09   Past Surgical History  Procedure Laterality Date  . Cesarean section  1999  . Appendectomy  1997  . Exploratory laparotomy  1999  . Right foot surgery  2008, 2011    hardware in the foot   Family History  Problem Relation Age of Onset  . Hypertension Mother    History  Substance Use Topics  . Smoking status: Never Smoker   . Smokeless tobacco: Not on file  . Alcohol Use: Yes     Comment: Rarely    OB History   Grav Para Term Preterm Abortions TAB SAB Ect Mult Living   1              Review of Systems + sore throat + cough No pleuritic pain No wheezing + nasal congestion + post-nasal drainage + sinus pain/pressure No itchy/red eyes No earache No hemoptysis + SOB No fever/chills No nausea No vomiting No abdominal pain No diarrhea No urinary symptoms No skin rash + fatigue No myalgias No headache    Allergies  Hydrocodone  Home Medications   Prior to Admission medications   Medication Sig Start Date End Date Taking? Authorizing Provider  azithromycin (ZITHROMAX Z-PAK) 250 MG tablet Take 2 tabs today; then begin one tab once daily for 4 more days. (Rx void after 11/09/13) 11/01/13   Kandra Nicolas, MD  Beclomethasone Dipropionate (QNASL) 80 MCG/ACT AERS 2 puffs each nostril daily 10/08/13   Sean Hommel, DO  benzonatate (TESSALON) 200 MG capsule Take 1 capsule (200 mg total) by mouth at bedtime. Take as  needed for cough 11/01/13   Kandra Nicolas, MD  Cholecalciferol (D3 ADULT PO) Take 1 tablet by mouth daily.    Historical Provider, MD  Cyanocobalamin (B-12) 100 MCG TABS Take 1 tablet by mouth daily.    Historical Provider, MD  ketoconazole (NIZORAL) 2 % cream Apply 1 application topically daily. X 2 weeks 06/25/13   Hali Marry, MD  omeprazole (PRILOSEC) 20 MG capsule Take one cap each evening 30 minutes before food. 10/12/13   Kandra Nicolas, MD  Prenatal Vit-Fe Fumarate-FA (PRENATAL VITAMINS PLUS) 27-1 MG TABS Take 1 tablet by mouth daily. 11/12/12   Hali Marry, MD   BP 134/86  Pulse 73  Temp(Src) 98.2 F (36.8 C) (Oral)  Ht _0  (1.626 m)  Wt 216 lb (97.977 kg)  BMI 37.06 kg/m2  SpO2 99% Physical Exam Nursing notes and Vital Signs reviewed. Appearance:  Patient appears stated age, and in no acute distress.  Patient is obese (BMI 37.1) Eyes:  Pupils are equal, round, and reactive to light and accomodation.  Extraocular movement is intact.  Conjunctivae are not inflamed  Ears:  Canals normal.  Tympanic membranes normal.  Nose:  Congested turbinates.  No sinus tenderness.    Pharynx:  Mildly erythematous without  exudate Neck:  Supple.   Tender enlarged posterior nodes are palpated bilaterally  Lungs:  Clear to auscultation.  Breath sounds are equal.  Heart:  Regular rate and rhythm without murmurs, rubs, or gallops.  Abdomen:  Nontender without masses or hepatosplenomegaly.  Bowel sounds are present.  No CVA or flank tenderness.  Extremities:  No edema.  No calf tenderness Skin:  No rash present.   ED Course  Procedures  none    Labs Reviewed  POCT RAPID STREP A (OFFICE) negative    Imaging Review Dg Sinuses Complete  11/01/2013   CLINICAL DATA:  Exposure to black mold  EXAM: PARANASAL SINUSES - COMPLETE 3 + VIEW  COMPARISON:  None.  FINDINGS: The paranasal sinus are aerated. There is no evidence of sinus opacification air-fluid levels or mucosal thickening. No  significant bone abnormalities are seen. Lucencies in the skull superior to the frontal sinuses could represent lacunes or air cells. This is assuming no history of multiple myeloma or other bone abnormality.  IMPRESSION: Negative.   Electronically Signed   By: Conchita Paris M.D.   On: 11/01/2013 12:56   Dg Chest 2 View  11/01/2013   CLINICAL DATA:  Exposure to mold  EXAM: CHEST  2 VIEW  COMPARISON:  None.  FINDINGS: The heart size and mediastinal contours are within normal limits. Both lungs are clear. The visualized skeletal structures are unremarkable.  IMPRESSION: No active cardiopulmonary disease.   Electronically Signed   By: Conchita Paris M.D.   On: 11/01/2013 12:53     MDM   1. Viral URI with cough    There is no evidence of bacterial infection today.  Prescription written for Benzonatate [facility]) to take at bedtime for night-time cough.  Take plain Mucinex (1200 mg guaifenesin) twice daily for cough and congestion.  May add Sudafed for sinus congestion.   Increase fluid intake, rest. May use Afrin nasal spray (or generic oxymetazoline) twice daily for about 5 days.  Also recommend using saline nasal spray several times daily and saline nasal irrigation (AYR is a common brand) Try warm salt water gargles for sore throat.  Stop all antihistamines for now, and other non-prescription cough/cold preparations. May take Ibuprofen 243m, 4 tabs every 8 hours with food for sore throat, headache, etc. Begin Azithromycin if not improving about one week or if persistent fever develops (Given a prescription to hold, with an expiration date)  Follow-up with family doctor if not improving about10 days.    SKandra Nicolas MD 11/03/13 [phone]

## 2013-11-01 NOTE — ED Notes (Signed)
Exposed to black mold at work yesterday.  Sx of scratchy throat and post neck discomfort plus some congestion began 8/1 pm.  Sx worse today with productive cough (orange sputum) and throat sore.

## 2013-11-01 NOTE — Discharge Instructions (Signed)
Take plain Mucinex (1200 mg guaifenesin) twice daily for cough and congestion.  May add Sudafed for sinus congestion.   Increase fluid intake, rest. May use Afrin nasal spray (or generic oxymetazoline) twice daily for about 5 days.  Also recommend using saline nasal spray several times daily and saline nasal irrigation (AYR is a common brand) Try warm salt water gargles for sore throat.  Stop all antihistamines for now, and other non-prescription cough/cold preparations. May take Ibuprofen 200mg , 4 tabs every 8 hours with food for sore throat, headache, etc. Begin Azithromycin if not improving about one week or if persistent fever develops   Follow-up with family doctor if not improving about10 days.

## 2013-11-02 LAB — STREP A DNA PROBE: GASP: NEGATIVE

## 2013-11-06 ENCOUNTER — Telehealth: Payer: Self-pay | Admitting: Emergency Medicine

## 2013-12-02 ENCOUNTER — Other Ambulatory Visit: Payer: Self-pay | Admitting: Family Medicine

## 2014-08-18 LAB — HM PAP SMEAR: HM Pap smear: ABNORMAL

## 2015-07-08 IMAGING — CR DG SINUSES COMPLETE 3+V
4 series · 4 of 4 positions shown · non-contrast
Comparison: None.

CLINICAL DATA: Exposure to black mold

EXAM:
PARANASAL SINUSES - COMPLETE 3 + VIEW

[view not recorded (1 of 4)]
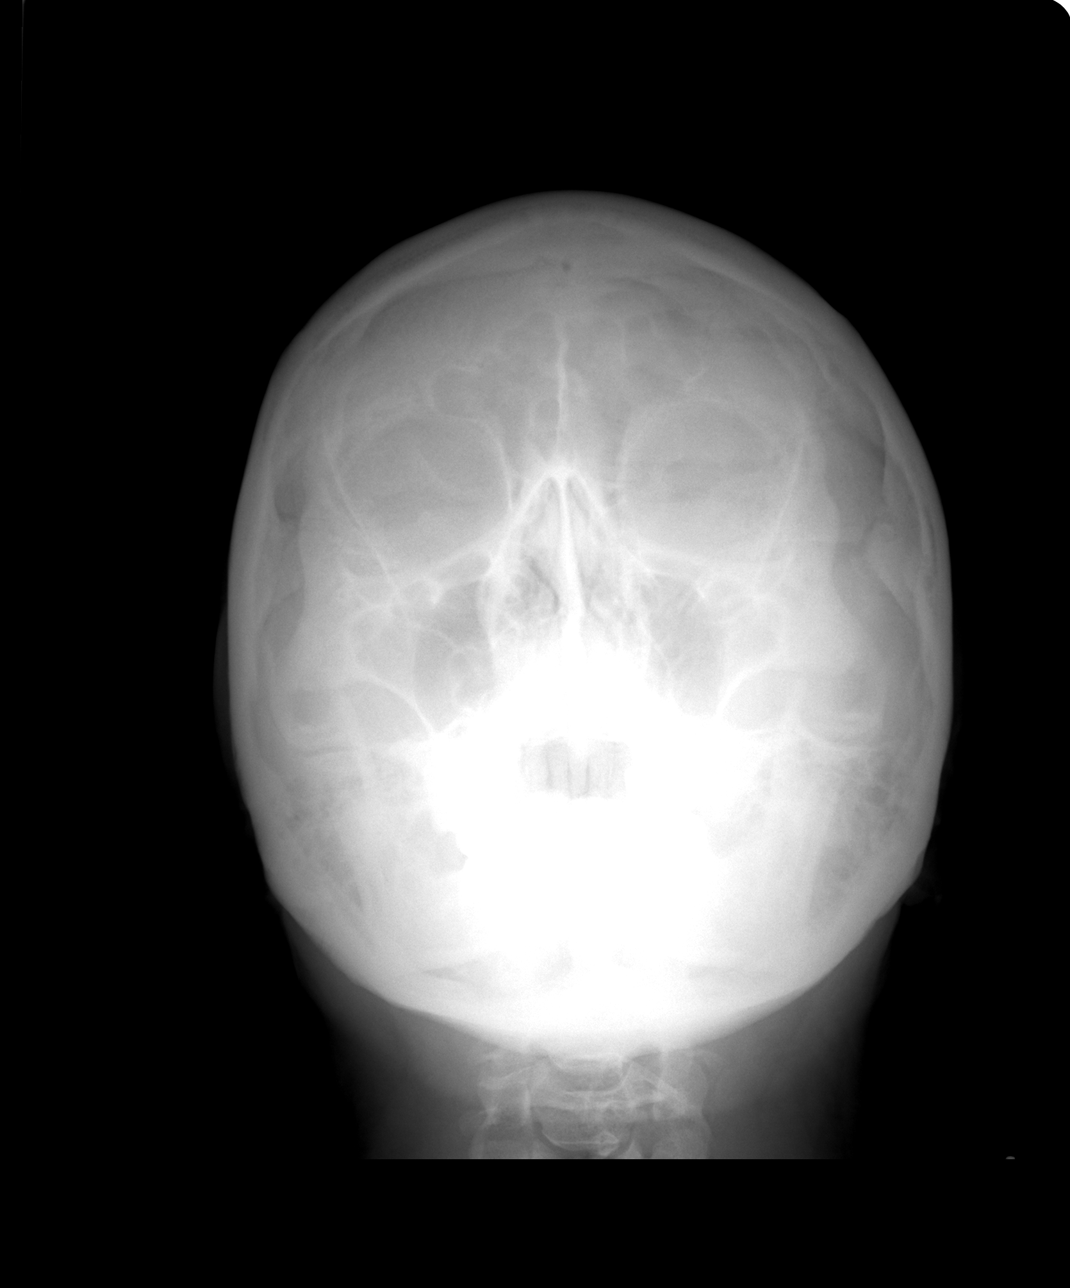

[view not recorded (2 of 4)]
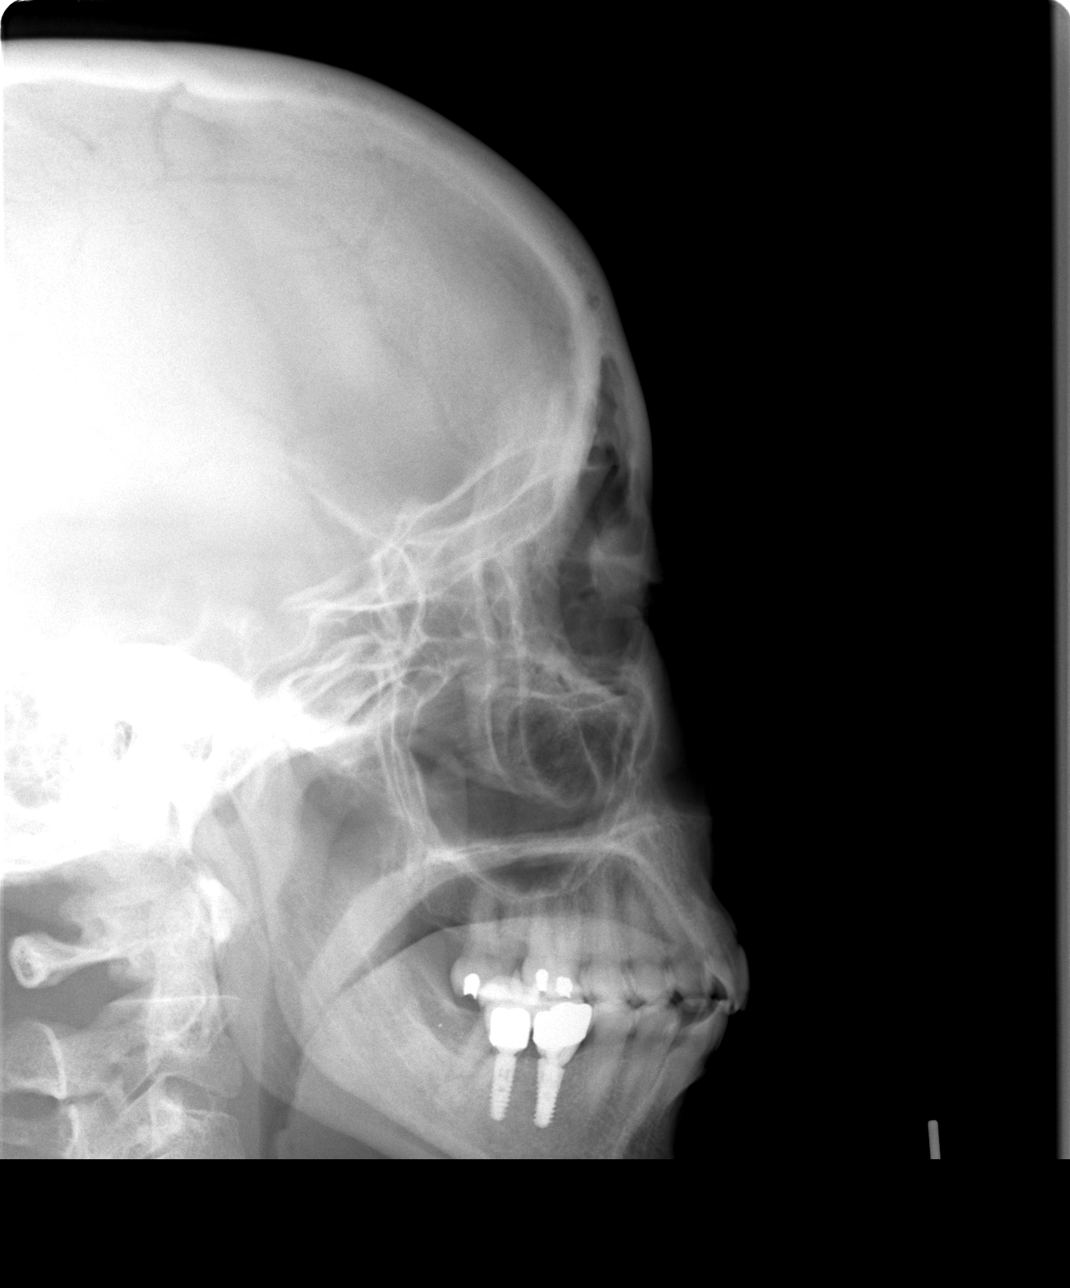

[view not recorded (3 of 4)]
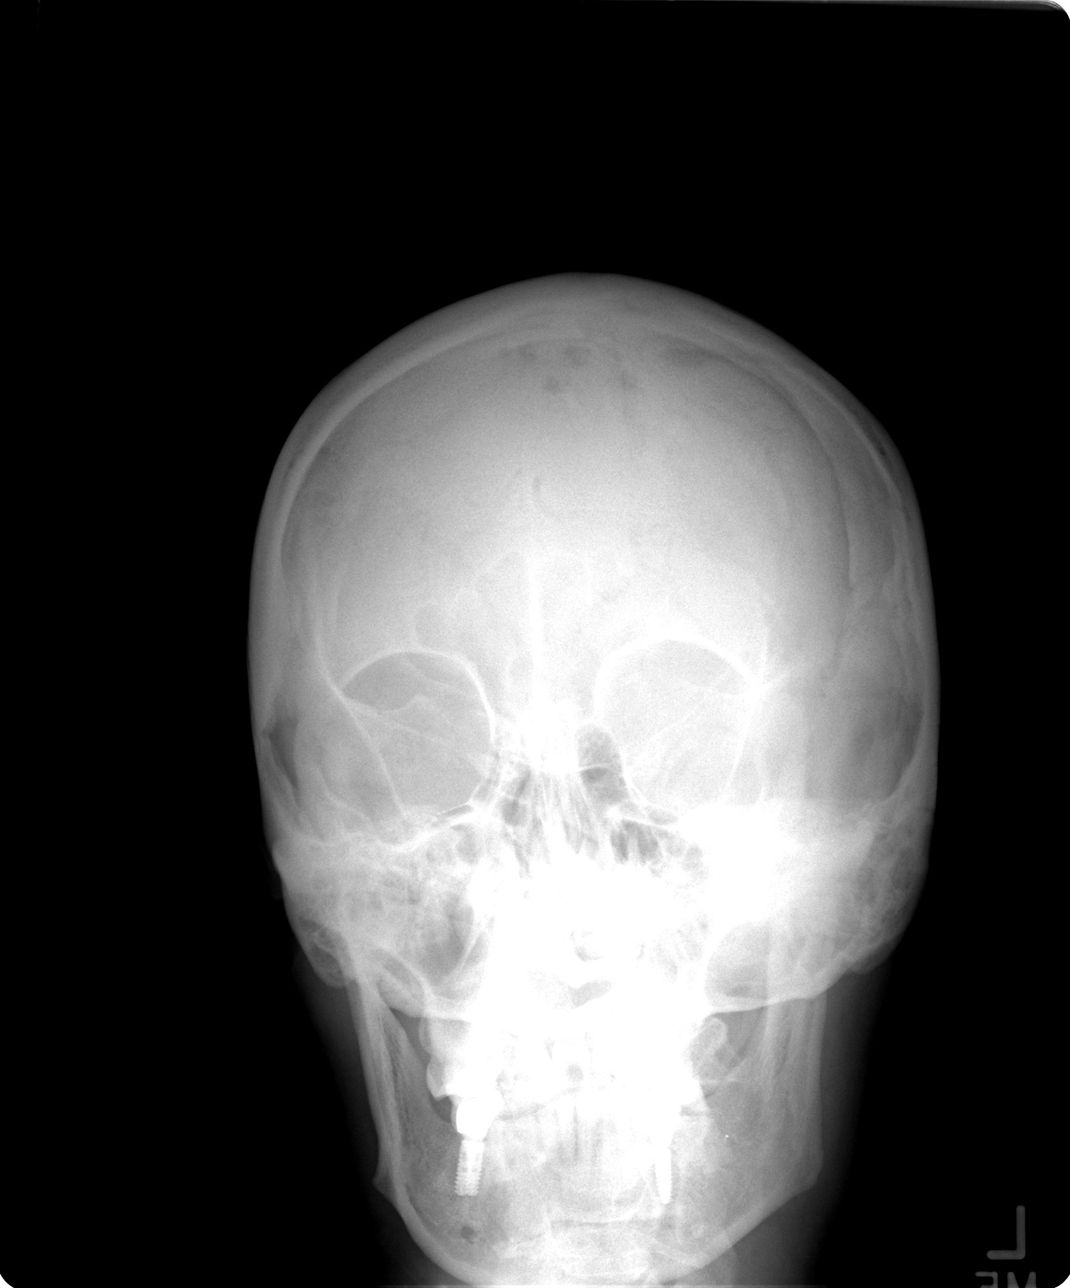

[view not recorded (4 of 4)]
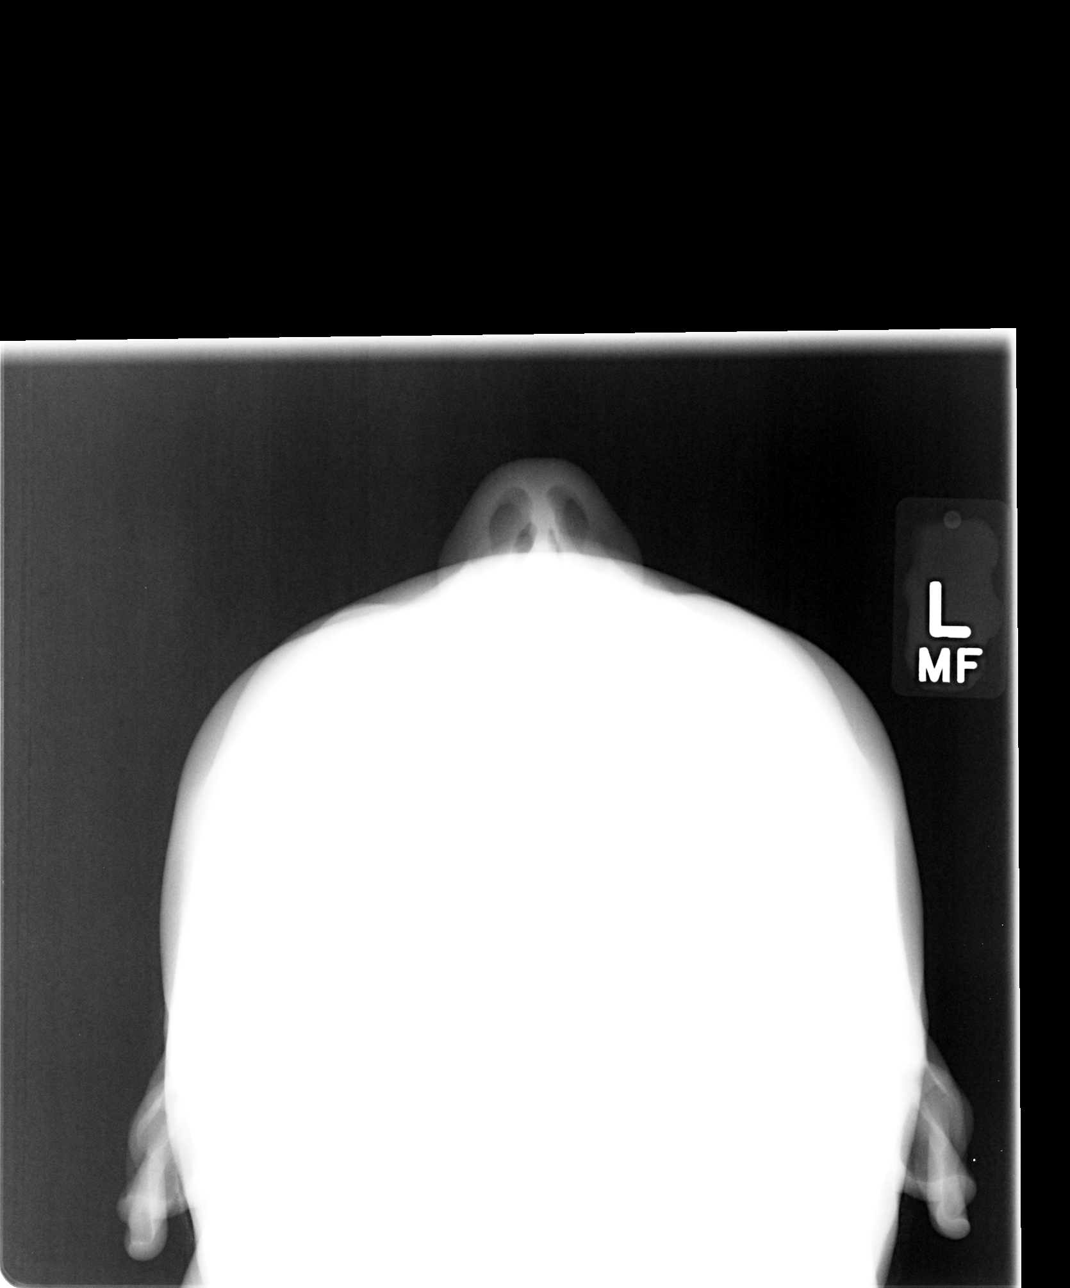

[4 of 4 positions shown; findings below may reference images not displayed]

FINDINGS: The paranasal sinus are aerated. There is no evidence of sinus
opacification air-fluid levels or mucosal thickening. No significant
bone abnormalities are seen. Lucencies in the skull superior to the
frontal sinuses could represent lacunes or air cells. This is
assuming no history of multiple myeloma or other bone abnormality.
IMPRESSION: Negative.

## 2015-07-08 IMAGING — CR DG CHEST 2V
2 series · 2 of 2 positions shown · non-contrast
Comparison: None.

CLINICAL DATA: Exposure to mold

EXAM:
CHEST  2 VIEW

[view not recorded (1 of 2)]
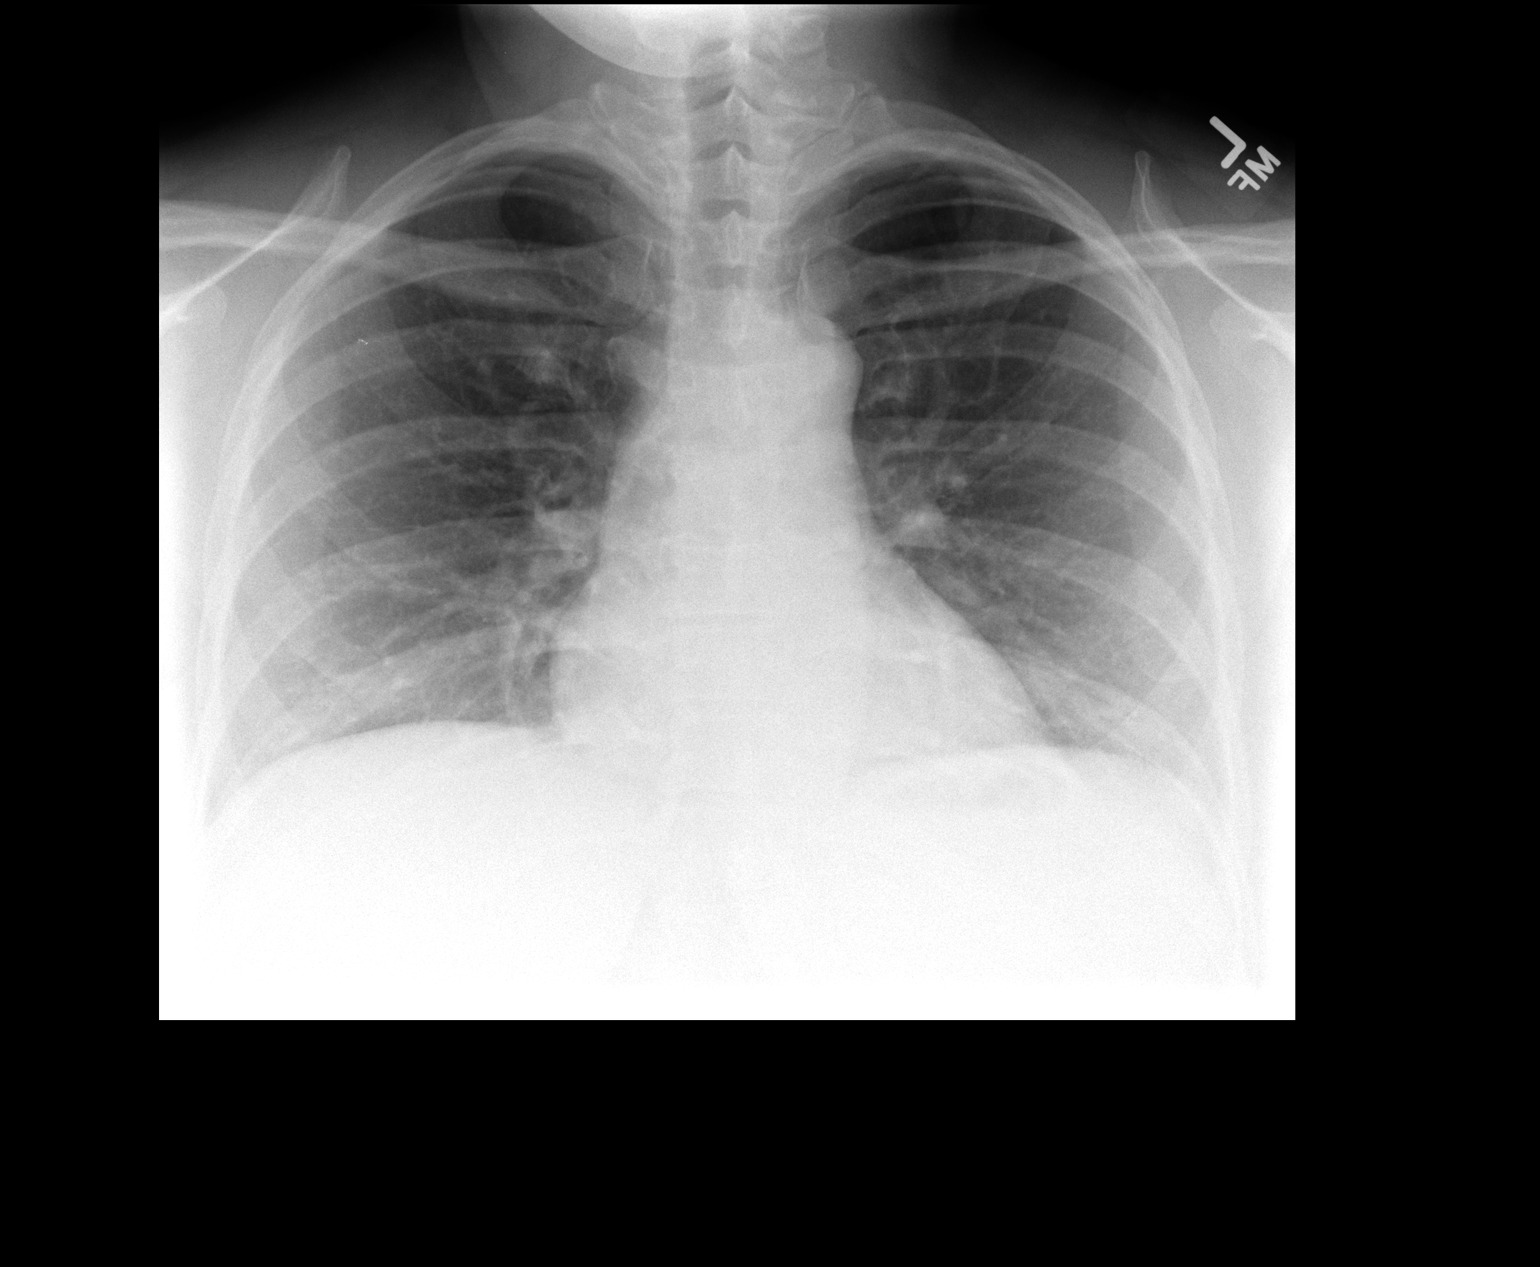

[view not recorded (2 of 2)]
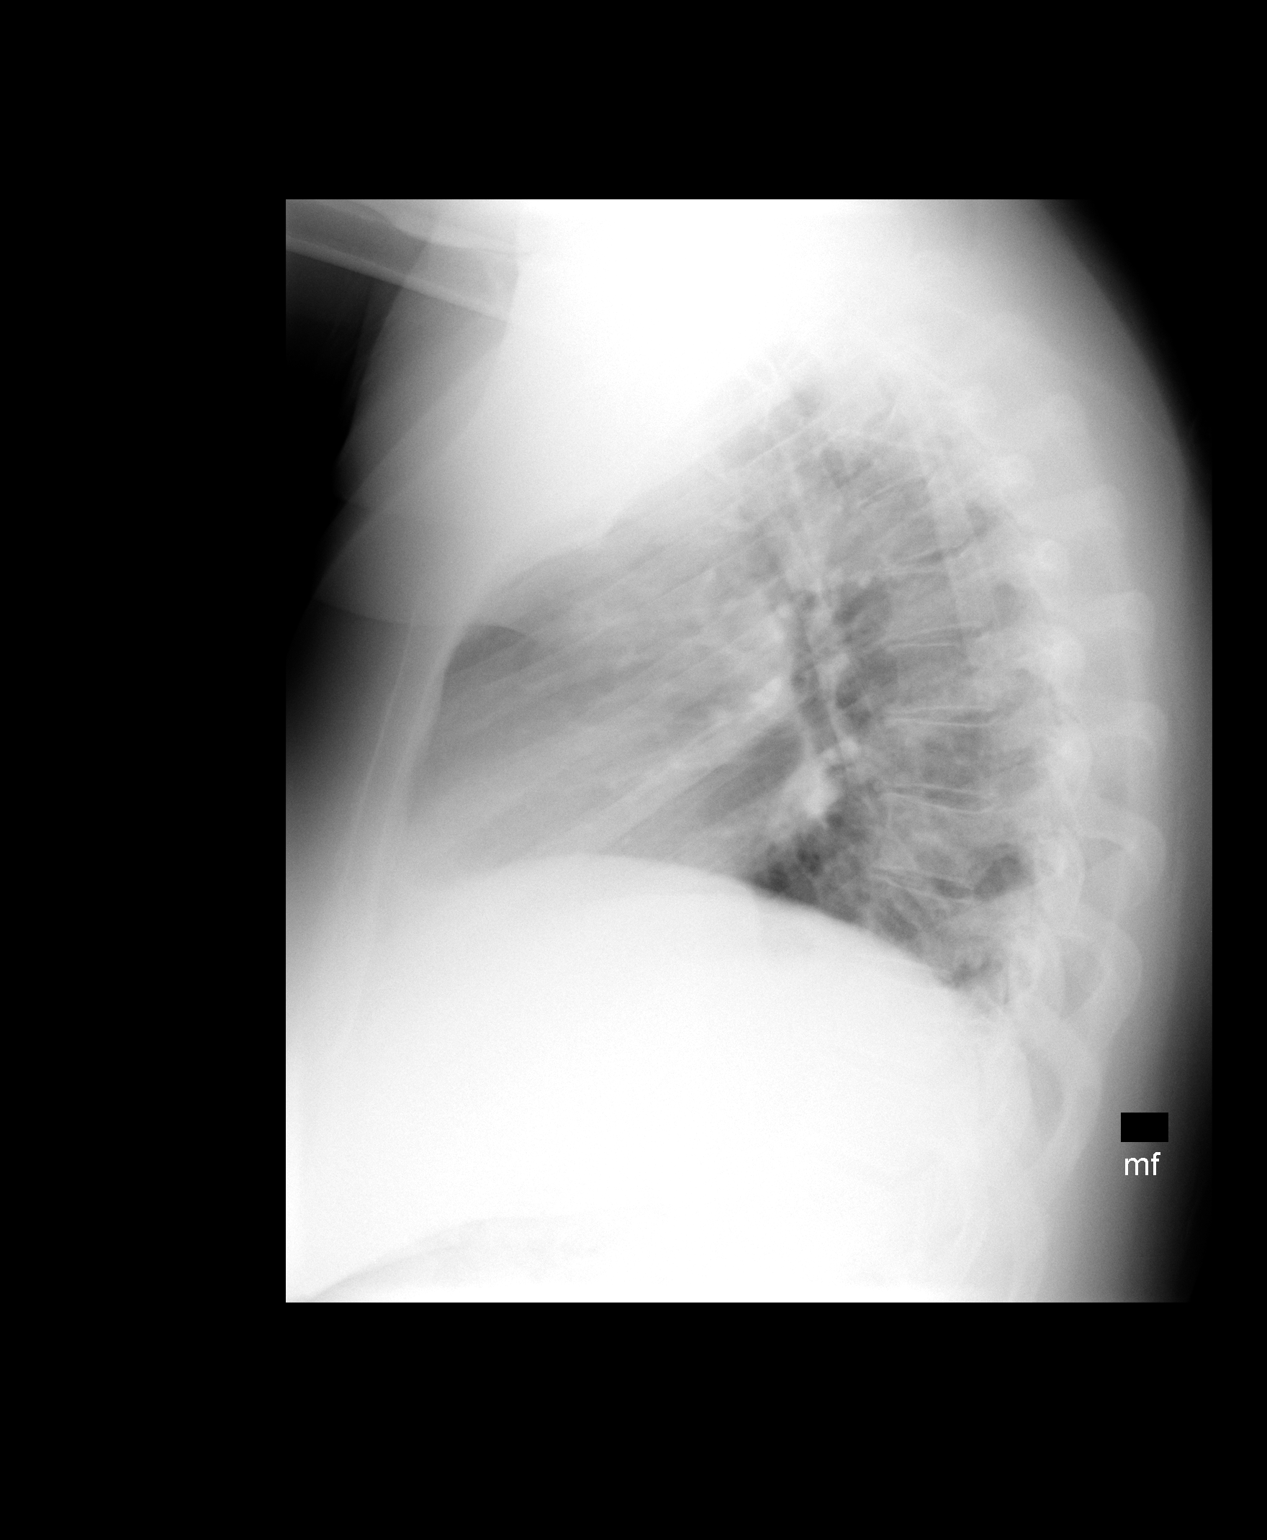

[2 of 2 positions shown; findings below may reference images not displayed]

FINDINGS: The heart size and mediastinal contours are within normal limits.
Both lungs are clear. The visualized skeletal structures are
unremarkable.
IMPRESSION: No active cardiopulmonary disease.

## 2015-12-14 ENCOUNTER — Telehealth: Payer: Self-pay

## 2015-12-14 NOTE — Telephone Encounter (Signed)
Pt is going on a cruise next week and would like to know could she get a Rx for motion patches. Please advise.

## 2015-12-15 MED ORDER — SCOPOLAMINE 1 MG/3DAYS TD PT72: 1.0000 | MEDICATED_PATCH | TRANSDERMAL | 0 refills | Status: DC

## 2015-12-15 NOTE — Telephone Encounter (Signed)
Ok will send rx for patch.

## 2015-12-16 NOTE — Telephone Encounter (Signed)
Pt.notified

## 2016-03-30 ENCOUNTER — Telehealth: Payer: Self-pay | Admitting: Family Medicine

## 2016-03-30 DIAGNOSIS — R030 Elevated blood-pressure reading, without diagnosis of hypertension: Secondary | ICD-10-CM

## 2016-03-30 DIAGNOSIS — E785 Hyperlipidemia, unspecified: Secondary | ICD-10-CM

## 2016-03-30 NOTE — Telephone Encounter (Signed)
Pt is having a cpe on Jan 15th and would like to get her blood work done in advance if possible . Thanks

## 2016-04-03 NOTE — Telephone Encounter (Signed)
[place] for CMP, lipid.

## 2016-04-03 NOTE — Telephone Encounter (Signed)
Labs placed, Pt advised.

## 2016-04-16 ENCOUNTER — Encounter: Payer: Self-pay | Admitting: Family Medicine

## 2016-04-16 ENCOUNTER — Ambulatory Visit (INDEPENDENT_AMBULATORY_CARE_PROVIDER_SITE_OTHER): Payer: Managed Care, Other (non HMO) | Admitting: Family Medicine

## 2016-04-16 ENCOUNTER — Other Ambulatory Visit: Payer: Self-pay | Admitting: Family Medicine

## 2016-04-16 VITALS — BP 135/85 | HR 70 | Wt 222.0 lb

## 2016-04-16 DIAGNOSIS — Z833 Family history of diabetes mellitus: Secondary | ICD-10-CM

## 2016-04-16 DIAGNOSIS — Z0000 Encounter for general adult medical examination without abnormal findings: Secondary | ICD-10-CM

## 2016-04-16 DIAGNOSIS — Z23 Encounter for immunization: Secondary | ICD-10-CM | POA: Diagnosis not present

## 2016-04-16 DIAGNOSIS — R7301 Impaired fasting glucose: Secondary | ICD-10-CM | POA: Diagnosis not present

## 2016-04-16 LAB — POCT GLYCOSYLATED HEMOGLOBIN (HGB A1C): Hemoglobin A1C: 5.7

## 2016-04-16 NOTE — Progress Notes (Signed)
Subjective:     Michele Roman is a 46 y.o. female and is here for a comprehensive physical exam. The patient reports no problems.She is doing well overall. She's been working hard on making some good food choices over the last couple of weeks and she started exercising about 2 weeks ago. She's been walking 30 minutes a day on the treadmill. She reports that her last Pap smear was done at [place] in November 2016. She says she plans to call to get in for this year. She does still take an iron supplement, multivitamin, vitamin D and vitamin B complex.  Social History   Social History  . Marital status: Married    Spouse name: Matt  . Number of children: 1  . Years of education: N/A   Occupational History  . [place] History Main Topics  . Smoking status: Never Smoker  . Smokeless tobacco: Not on file  . Alcohol use Yes     Comment: Rarely   . Drug use: Unknown  . Sexual activity: Yes    Partners: Male   Other Topics Concern  . 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    Health Maintenance  Topic Date Due  . HIV Screening  11/17/1986  . PAP SMEAR  07/02/2015  . TETANUS/TDAP  12/22/2015  . INFLUENZA VACCINE  04/16/2017 (Originally 11/01/2015)    The following portions of the patient's history were reviewed and updated as appropriate: allergies, current medications, past family history, past medical history, past social history, past surgical history and problem list.  Review of Systems A comprehensive review of systems was negative.   Objective:    BP 135/85   Pulse 70   Wt 222 lb (100.7 kg)   BMI 38.11 kg/m  General appearance: alert, cooperative and appears stated age Head: Normocephalic, without obvious abnormality, atraumatic Eyes: conj clear, EOMI, PEERLA Ears: normal TM's and external ear canals both ears Nose: Nares normal. Septum midline. Mucosa normal. No drainage or sinus  tenderness. Throat: lips, mucosa, and tongue normal; teeth and gums normal Neck: no adenopathy, no carotid bruit, no JVD, supple, symmetrical, trachea midline and thyroid not enlarged, symmetric, no tenderness/mass/nodules Back: symmetric, no curvature. ROM normal. No CVA tenderness. Lungs: clear to auscultation bilaterally Heart: regular rate and rhythm, S1, S2 normal, no murmur, click, rub or gallop Abdomen: soft, non-tender; bowel sounds normal; no masses,  no organomegaly Extremities: extremities normal, atraumatic, no cyanosis or edema Pulses: 2+ and symmetric Skin: Skin color, texture, turgor normal. No rashes or lesions Lymph nodes: Cervical adenopathy: nl and Axillary adenopathy: nl Neurologic: Alert and oriented X 3, normal strength and tone. Normal symmetric reflexes. Normal coordination and gait    Assessment:    Healthy female exam.      Plan:     See After Visit Summary for Counseling Recommendations   Keep up a regular exercise program and make sure you are eating a healthy diet Try to eat 4 servings of dairy a day, or if you are lactose intolerant take a calcium with vitamin D daily.  Your vaccines are up to date. Tdap given today.  Fam Hx DM - check A1C today. 11 A1c of 5.7 today which is consistent with impaired fasting glucose. Will have her focus on diet and exercise and recheck in 6 months.

## 2016-04-17 LAB — COMPLETE METABOLIC PANEL WITH GFR
ALT: 14 U/L (ref 6–29)
AST: 22 U/L (ref 10–30)
Albumin: 3.8 g/dL (ref 3.6–5.1)
Alkaline Phosphatase: 74 U/L (ref 33–115)
BUN: 6 mg/dL — ABNORMAL LOW (ref 7–25)
CO2: 28 mmol/L (ref 20–31)
Calcium: 9.4 mg/dL (ref 8.6–10.2)
Chloride: 106 mmol/L (ref 98–110)
Creat: 0.82 mg/dL (ref 0.50–1.10)
GFR, Est African American: >89 mL/min (ref >=60)
GFR, Est Non African American: 87 mL/min (ref >=60)
Glucose, Bld: 85 mg/dL (ref 65–99)
Potassium: 4.2 mmol/L (ref 3.5–5.3)
Sodium: 140 mmol/L (ref 135–146)
Total Bilirubin: 0.4 mg/dL (ref 0.2–1.2)
Total Protein: 7.4 g/dL (ref 6.1–8.1)

## 2016-04-17 LAB — LIPID PANEL
Cholesterol: 199 mg/dL (ref <200)
HDL: 38 mg/dL — ABNORMAL LOW (ref >50)
LDL Cholesterol: 142 mg/dL — ABNORMAL HIGH (ref <100)
Total CHOL/HDL Ratio: 5.2 Ratio — ABNORMAL HIGH (ref <5.0)
Triglycerides: 93 mg/dL (ref <150)
VLDL: 19 mg/dL (ref <30)

## 2016-04-18 ENCOUNTER — Encounter: Payer: Self-pay | Admitting: Family Medicine

## 2016-04-18 LAB — FERRITIN: Ferritin: 7 ng/mL — ABNORMAL LOW (ref 10–232)

## 2017-04-22 ENCOUNTER — Ambulatory Visit (INDEPENDENT_AMBULATORY_CARE_PROVIDER_SITE_OTHER): Payer: 59 | Admitting: Family Medicine

## 2017-04-22 ENCOUNTER — Encounter: Payer: Self-pay | Admitting: Family Medicine

## 2017-04-22 VITALS — BP 129/85 | HR 36 | Ht 64.0 in | Wt 218.0 lb

## 2017-04-22 DIAGNOSIS — E785 Hyperlipidemia, unspecified: Secondary | ICD-10-CM | POA: Diagnosis not present

## 2017-04-22 DIAGNOSIS — Z0000 Encounter for general adult medical examination without abnormal findings: Secondary | ICD-10-CM

## 2017-04-22 DIAGNOSIS — R790 Abnormal level of blood mineral: Secondary | ICD-10-CM | POA: Diagnosis not present

## 2017-04-22 DIAGNOSIS — R7301 Impaired fasting glucose: Secondary | ICD-10-CM | POA: Diagnosis not present

## 2017-04-22 DIAGNOSIS — K760 Fatty (change of) liver, not elsewhere classified: Secondary | ICD-10-CM | POA: Diagnosis not present

## 2017-04-22 MED ORDER — FUSION PLUS PO CAPS: 1.0000 | ORAL_CAPSULE | Freq: Every day | ORAL | 3 refills | Status: DC

## 2017-04-22 NOTE — Patient Instructions (Signed)
Preventive Care 18-39 Years, Female Preventive care refers to lifestyle choices and visits with your health care provider that can promote health and wellness. What does preventive care include?  A yearly physical exam. This is also called an annual well check.  Dental exams once or twice a year.  Routine eye exams. Ask your health care provider how often you should have your eyes checked.  Personal lifestyle choices, including: ? Daily care of your teeth and gums. ? Regular physical activity. ? Eating a healthy diet. ? Avoiding tobacco and drug use. ? Limiting alcohol use. ? Practicing safe sex. ? Taking vitamin and mineral supplements as recommended by your health care provider. What happens during an annual well check? The services and screenings done by your health care provider during your annual well check will depend on your age, overall health, lifestyle risk factors, and family history of disease. Counseling Your health care provider may ask you questions about your:  Alcohol use.  Tobacco use.  Drug use.  Emotional well-being.  Home and relationship well-being.  Sexual activity.  Eating habits.  Work and work Statistician.  Method of birth control.  Menstrual cycle.  Pregnancy history.  Screening You may have the following tests or measurements:  Height, weight, and BMI.  Diabetes screening. This is done by checking your blood sugar (glucose) after you have not eaten for a while (fasting).  Blood pressure.  Lipid and cholesterol levels. These may be checked every 5 years starting at age 38.  Skin check.  Hepatitis C blood test.  Hepatitis B blood test.  Sexually transmitted disease (STD) testing.  BRCA-related cancer screening. This may be done if you have a family history of breast, ovarian, tubal, or peritoneal cancers.  Pelvic exam and Pap test. This may be done every 3 years starting at age 38. Starting at age 30, this may be done  every 5 years if you have a Pap test in combination with an HPV test.  Discuss your test results, treatment options, and if necessary, the need for more tests with your health care provider. Vaccines Your health care provider may recommend certain vaccines, such as:  Influenza vaccine. This is recommended every year.  Tetanus, diphtheria, and acellular pertussis (Tdap, Td) vaccine. You may need a Td booster every 10 years.  Varicella vaccine. You may need this if you have not been vaccinated.  HPV vaccine. If you are 39 or younger, you may need three doses over 6 months.  Measles, mumps, and rubella (MMR) vaccine. You may need at least one dose of MMR. You may also need a second dose.  Pneumococcal 13-valent conjugate (PCV13) vaccine. You may need this if you have certain conditions and were not previously vaccinated.  Pneumococcal polysaccharide (PPSV23) vaccine. You may need one or two doses if you smoke cigarettes or if you have certain conditions.  Meningococcal vaccine. One dose is recommended if you are age 68-21 years and a first-year college student living in a residence hall, or if you have one of several medical conditions. You may also need additional booster doses.  Hepatitis A vaccine. You may need this if you have certain conditions or if you travel or work in places where you may be exposed to hepatitis A.  Hepatitis B vaccine. You may need this if you have certain conditions or if you travel or work in places where you may be exposed to hepatitis B.  Haemophilus influenzae type b (Hib) vaccine. You may need this  if you have certain risk factors.  Talk to your health care provider about which screenings and vaccines you need and how often you need them. This information is not intended to replace advice given to you by your health care provider. Make sure you discuss any questions you have with your health care provider. Document Released: 05/15/2001 Document Revised:  12/07/2015 Document Reviewed: 01/18/2015 Elsevier Interactive Patient Education  2018 Elsevier Inc.  

## 2017-04-22 NOTE — Progress Notes (Signed)
Subjective:     Michele Roman is a 47 y.o. female and is here for a comprehensive physical exam. The patient reports no problems.  Social History   Socioeconomic History  . Marital status: Married    Spouse name: Matt  . Number of children: 1  . Years of education: Not on file  . Highest education level: Not on file  Social Needs  . Financial resource strain: Not on file  . Food insecurity - worry: Not on file  . Food insecurity - inability: Not on file  . Transportation needs - medical: Not on file  . Transportation needs - non-medical: Not on file  Occupational History  . Occupation: Engineer, agricultural    Comment: [employer]  Tobacco Use  . Smoking status: Never Smoker  Substance and Sexual Activity  . Alcohol use: Yes    Comment: Rarely   . Drug use: Not on file  . Sexual activity: Yes    Partners: Male  Other Topics Concern  . 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    Health Maintenance  Topic Date Due  . HIV Screening  11/17/1986  . INFLUENZA VACCINE  10/31/2016  . PAP SMEAR  08/17/2017  . TETANUS/TDAP  04/16/2026    The following portions of the patient's history were reviewed and updated as appropriate: allergies, current medications, past family history, past medical history, past social history, past surgical history and problem list.  Review of Systems A comprehensive review of systems was negative.   Objective:    There were no vitals taken for this visit. General appearance: alert, cooperative and appears stated age Head: Normocephalic, without obvious abnormality, atraumatic Eyes: conj clear, EOMi, PEERLA Ears: normal TM's and external ear canals both ears Nose: Nares normal. Septum midline. Mucosa normal. No drainage or sinus tenderness. Throat: lips, mucosa, and tongue normal; teeth and gums normal Neck: no adenopathy, no carotid bruit, no JVD, supple, symmetrical, trachea midline and thyroid not enlarged,  symmetric, no tenderness/mass/nodules Back: symmetric, no curvature. ROM normal. No CVA tenderness. Lungs: clear to auscultation bilaterally Breasts: not examined Heart: regular rate and rhythm, S1, S2 normal, no murmur, click, rub or gallop Abdomen: soft, non-tender; bowel sounds normal; no masses,  no organomegaly Pelvic: deferred Extremities: extremities normal, atraumatic, no cyanosis or edema Pulses: 2+ and symmetric Skin: Skin color, texture, turgor normal. No rashes or lesions Lymph nodes: Cervical adenopathy: nl and Supraclavicular adenopathy: nl Neurologic: Alert and oriented X 3, normal strength and tone. Normal symmetric reflexes. Normal coordination and gait    Assessment:    Healthy female exam.     Plan:     See After Visit Summary for Counseling Recommendations   Keep up a regular exercise program and make sure you are eating a healthy diet Try to eat 4 servings of dairy a day, or if you are lactose intolerant take a calcium with vitamin D daily.  Your vaccines are up to date.

## 2017-06-15 LAB — CBC
HCT: 36.3 % (ref 35.0–45.0)
Hemoglobin: 11.2 g/dL — ABNORMAL LOW (ref 11.7–15.5)
MCH: 21.4 pg — ABNORMAL LOW (ref 27.0–33.0)
MCHC: 30.9 g/dL — ABNORMAL LOW (ref 32.0–36.0)
MCV: 69.3 fL — ABNORMAL LOW (ref 80.0–100.0)
MPV: 10 fL (ref 7.5–12.5)
Platelets: 336 10*3/uL (ref 140–400)
RBC: 5.24 10*6/uL — ABNORMAL HIGH (ref 3.80–5.10)
RDW: 18.3 % — ABNORMAL HIGH (ref 11.0–15.0)
WBC: 6.2 10*3/uL (ref 3.8–10.8)

## 2017-06-15 LAB — COMPLETE METABOLIC PANEL WITH GFR
AG Ratio: 1.2 (calc) (ref 1.0–2.5)
ALT: 10 U/L (ref 6–29)
AST: 16 U/L (ref 10–35)
Albumin: 3.8 g/dL (ref 3.6–5.1)
Alkaline phosphatase (APISO): 79 U/L (ref 33–115)
BUN: 13 mg/dL (ref 7–25)
CO2: 26 mmol/L (ref 20–32)
Calcium: 9.1 mg/dL (ref 8.6–10.2)
Chloride: 106 mmol/L (ref 98–110)
Creat: 0.84 mg/dL (ref 0.50–1.10)
GFR, Est African American: 97 mL/min/{1.73_m2} (ref >=60)
GFR, Est Non African American: 84 mL/min/{1.73_m2} (ref >=60)
Globulin: 3.3 g/dL (calc) (ref 1.9–3.7)
Glucose, Bld: 95 mg/dL (ref 65–99)
Potassium: 4.2 mmol/L (ref 3.5–5.3)
Sodium: 139 mmol/L (ref 135–146)
Total Bilirubin: 0.3 mg/dL (ref 0.2–1.2)
Total Protein: 7.1 g/dL (ref 6.1–8.1)

## 2017-06-15 LAB — LIPID PANEL
Cholesterol: 211 mg/dL — ABNORMAL HIGH (ref <200)
HDL: 38 mg/dL — ABNORMAL LOW (ref >50)
LDL Cholesterol (Calc): 153 mg/dL (calc) — ABNORMAL HIGH
Non-HDL Cholesterol (Calc): 173 mg/dL (calc) — ABNORMAL HIGH (ref <130)
Total CHOL/HDL Ratio: 5.6 (calc) — ABNORMAL HIGH (ref <5.0)
Triglycerides: 92 mg/dL (ref <150)

## 2017-06-15 LAB — HEMOGLOBIN A1C
Hgb A1c MFr Bld: 5.7 % of total Hgb — ABNORMAL HIGH (ref <5.7)
Mean Plasma Glucose: 117 (calc)
eAG (mmol/L): 6.5 (calc)

## 2017-06-15 LAB — FERRITIN: Ferritin: 7 ng/mL — ABNORMAL LOW (ref 10–232)

## 2017-07-16 ENCOUNTER — Ambulatory Visit (INDEPENDENT_AMBULATORY_CARE_PROVIDER_SITE_OTHER): Payer: 59 | Admitting: Family Medicine

## 2017-07-16 ENCOUNTER — Encounter: Payer: Self-pay | Admitting: Family Medicine

## 2017-07-16 VITALS — BP 137/98 | HR 76 | Temp 97.6°F | Ht 63.0 in | Wt 221.0 lb

## 2017-07-16 DIAGNOSIS — M25559 Pain in unspecified hip: Secondary | ICD-10-CM

## 2017-07-16 DIAGNOSIS — N898 Other specified noninflammatory disorders of vagina: Secondary | ICD-10-CM

## 2017-07-16 LAB — POCT URINALYSIS DIPSTICK
Bilirubin, UA: NEGATIVE
Glucose, UA: NEGATIVE
Ketones, UA: NEGATIVE
Nitrite, UA: NEGATIVE
Protein, UA: NEGATIVE
Spec Grav, UA: 1.01 (ref 1.010–1.025)
Urobilinogen, UA: 0.2 E.U./dL
pH, UA: 7 (ref 5.0–8.0)

## 2017-07-16 LAB — WET PREP FOR TRICH, YEAST, CLUE
MICRO NUMBER:: 90466575
Specimen Quality: ADEQUATE

## 2017-07-16 NOTE — Progress Notes (Signed)
Michele Roman is a 47 y.o. female who presents to [place]: [facility] today for vaginal irritation.  Symptoms present for about 2 weeks.  Patient's last menstrual period was about a week ago.  Last month she had vaginal irritation and urinary symptoms and was seen by her OB/GYN provider at [facility] diagnosed UTI and treated with antibiotics.  She notes she no longer has any urinary symptoms but notes manual vaginal irritation.  She denies discharge.  No fevers or chills vomiting or diarrhea.   Past Medical History:  Diagnosis Date  . H/O colonoscopy 03/01/09   Past Surgical History:  Procedure Laterality Date  . APPENDECTOMY  1997  . CESAREAN SECTION  1999  . EXPLORATORY LAPAROTOMY  1999  . Right foot surgery  2008, 2011   hardware in the foot   Social History   Tobacco Use  . Smoking status: Never Smoker  . Smokeless tobacco: Never Used  Substance Use Topics  . Alcohol use: Yes    Comment: Rarely    family history includes Hypertension in her brother and mother.  ROS as above:  Medications: Current Outpatient Medications  Medication Sig Dispense Refill  . b complex vitamins tablet Take 1 tablet by mouth daily.    . Cholecalciferol (D3 ADULT PO) Take 1 tablet by mouth daily.    . Iron-FA-B Cmp-C-Biot-Probiotic (FUSION PLUS) CAPS Take 1 capsule by mouth daily. 90 capsule 3  . Multiple Vitamins-Minerals (MULTIVITAMIN ADULT PO) Take 1 tablet by mouth daily.     No current facility-administered medications for this visit.    Allergies  Allergen Reactions  . Hydrocodone     Health Maintenance Health Maintenance  Topic Date Due  . HIV Screening  11/17/1986  . INFLUENZA VACCINE  04/22/2018 (Originally 10/31/2017)  . PAP SMEAR  08/17/2017  . TETANUS/TDAP  04/16/2026     Exam:  BP (!) 137/98   Pulse 76   Temp 97.6 F (36.4 C) (Oral)   Ht 5\' 3"  (1.6 m)    Wt 221 lb (100.2 kg)   BMI 39.15 kg/m  Gen: Well NAD HEENT: EOMI,  MMM Lungs: Normal work of breathing. CTABL Heart: RRR no MRG Abd: NABS, Soft. Nondistended, Nontender Exts: Brisk capillary refill, warm and well perfused. GYN: Normal external genitalia.  Vaginal canal with thin brown discharge.  Cervix is normal-appearing bleeding or tenderness.  No adnexal masses to bimanual exam.  No significant cervical motion tenderness.   Results for orders placed or performed in visit on 07/16/17 (from the past 72 hour(s))  POCT Urinalysis Dipstick     Status: None   Collection Time: 07/16/17 11:19 AM  Result Value Ref Range   Color, UA yellow    Clarity, UA clear    Glucose, UA neg    Bilirubin, UA neg    Ketones, UA neg    Spec Grav, UA 1.010 1.010 - 1.025   Blood, UA trace    pH, UA 7.0 5.0 - 8.0   Protein, UA neg    Urobilinogen, UA 0.2 0.2 or 1.0 E.U./dL   Nitrite, UA neg    Leukocytes, UA  Negative   Appearance     Odor     No results found.    Assessment and Plan: 47 y.o. female with  Vaginal irritation with unclear etiology.  Wet prep GC chlamydia pending.  Urine culture pending.  Suspect BV is the most likely explanation.  Will treat when  results are back as symptoms are mild to moderate currently.   Orders Placed This Encounter  Procedures  . Urine Culture  . C. trachomatis/N. gonorrhoeae RNA  . WET PREP FOR TRICH, YEAST, CLUE    Standing Status:   Future    Number of Occurrences:   1    Standing Expiration Date:   07/17/2018  . POCT Urinalysis Dipstick   No orders of the defined types were placed in this encounter.    Discussed warning signs or symptoms. Please see discharge instructions. Patient expresses understanding.

## 2017-07-16 NOTE — Patient Instructions (Signed)
Thank you for coming in today. You should hear about results over the next few days.    Bacterial Vaginosis Bacterial vaginosis is a vaginal infection that occurs when the normal balance of bacteria in the vagina is disrupted. It results from an overgrowth of certain bacteria. This is the most common vaginal infection among women ages 9-44. Because bacterial vaginosis increases your risk for STIs (sexually transmitted infections), getting treated can help reduce your risk for chlamydia, gonorrhea, herpes, and HIV (human immunodeficiency virus). Treatment is also important for preventing complications in pregnant women, because this condition can cause an early (premature) delivery. What are the causes? This condition is caused by an increase in harmful bacteria that are normally present in small amounts in the vagina. However, the reason that the condition develops is not fully understood. What increases the risk? The following factors may make you more likely to develop this condition:  Having a new sexual partner or multiple sexual partners.  Having unprotected sex.  Douching.  Having an intrauterine device (IUD).  Smoking.  Drug and alcohol abuse.  Taking certain antibiotic medicines.  Being pregnant.  You cannot get bacterial vaginosis from toilet seats, bedding, swimming pools, or contact with objects around you. What are the signs or symptoms? Symptoms of this condition include:  Grey or white vaginal discharge. The discharge can also be watery or foamy.  A fish-like odor with discharge, especially after sexual intercourse or during menstruation.  Itching in and around the vagina.  Burning or pain with urination.  Some women with bacterial vaginosis have no signs or symptoms. How is this diagnosed? This condition is diagnosed based on:  Your medical history.  A physical exam of the vagina.  Testing a sample of vaginal fluid under a microscope to look for a large  amount of bad bacteria or abnormal cells. Your health care provider may use a cotton swab or a small wooden spatula to collect the sample.  How is this treated? This condition is treated with antibiotics. These may be given as a pill, a vaginal cream, or a medicine that is put into the vagina (suppository). If the condition comes back after treatment, a second round of antibiotics may be needed. Follow these instructions at home: Medicines  Take over-the-counter and prescription medicines only as told by your health care provider.  Take or use your antibiotic as told by your health care provider. Do not stop taking or using the antibiotic even if you start to feel better. General instructions  If you have a female sexual partner, tell her that you have a vaginal infection. She should see her health care provider and be treated if she has symptoms. If you have a female sexual partner, he does not need treatment.  During treatment: ? Avoid sexual activity until you finish treatment. ? Do not douche. ? Avoid alcohol as directed by your health care provider. ? Avoid breastfeeding as directed by your health care provider.  Drink enough water and fluids to keep your urine clear or pale yellow.  Keep the area around your vagina and rectum clean. ? Wash the area daily with warm water. ? Wipe yourself from front to back after using the toilet.  Keep all follow-up visits as told by your health care provider. This is important. How is this prevented?  Do not douche.  Wash the outside of your vagina with warm water only.  Use protection when having sex. This includes latex condoms and dental dams.  Limit  how many sexual partners you have. To help prevent bacterial vaginosis, it is best to have sex with just one partner (monogamous).  Make sure you and your sexual partner are tested for STIs.  Wear cotton or cotton-lined underwear.  Avoid wearing tight pants and pantyhose, especially during  summer.  Limit the amount of alcohol that you drink.  Do not use any products that contain nicotine or tobacco, such as cigarettes and e-cigarettes. If you need help quitting, ask your health care provider.  Do not use illegal drugs. Where to find more information:  Centers for Disease Control and Prevention: AppraiserFraud.fi  American Sexual Health Association (ASHA): www.ashastd.org  U.S. Department of Health and Financial controller, Office on Women's Health: DustingSprays.pl or SecuritiesCard.it Contact a health care provider if:  Your symptoms do not improve, even after treatment.  You have more discharge or pain when urinating.  You have a fever.  You have pain in your abdomen.  You have pain during sex.  You have vaginal bleeding between periods. Summary  Bacterial vaginosis is a vaginal infection that occurs when the normal balance of bacteria in the vagina is disrupted.  Because bacterial vaginosis increases your risk for STIs (sexually transmitted infections), getting treated can help reduce your risk for chlamydia, gonorrhea, herpes, and HIV (human immunodeficiency virus). Treatment is also important for preventing complications in pregnant women, because the condition can cause an early (premature) delivery.  This condition is treated with antibiotic medicines. These may be given as a pill, a vaginal cream, or a medicine that is put into the vagina (suppository). This information is not intended to replace advice given to you by your health care provider. Make sure you discuss any questions you have with your health care provider. Document Released: 03/19/2005 Document Revised: 07/23/2016 Document Reviewed: 12/03/2015 Elsevier Interactive Patient Education  [employer].

## 2017-07-17 LAB — C. TRACHOMATIS/N. GONORRHOEAE RNA
C. trachomatis RNA, TMA: NOT DETECTED
N. gonorrhoeae RNA, TMA: NOT DETECTED

## 2017-07-17 LAB — URINE CULTURE
MICRO NUMBER:: 90468011
Result:: NO GROWTH
SPECIMEN QUALITY:: ADEQUATE

## 2017-07-17 MED ORDER — FLUCONAZOLE 150 MG PO TABS: 150.0000 mg | ORAL_TABLET | Freq: Once | ORAL | 1 refills | Status: AC

## 2017-07-17 NOTE — Addendum Note (Signed)
Addended by: Gregor Hams on: 07/17/2017 05:44 AM   Modules accepted: Orders

## 2018-05-30 ENCOUNTER — Ambulatory Visit: Payer: 59 | Admitting: Family Medicine

## 2018-06-10 ENCOUNTER — Ambulatory Visit: Payer: 59 | Admitting: Family Medicine

## 2018-06-10 ENCOUNTER — Encounter: Payer: Self-pay | Admitting: Family Medicine

## 2018-06-10 ENCOUNTER — Other Ambulatory Visit: Payer: Self-pay

## 2018-06-10 VITALS — BP 120/78 | HR 92 | Temp 98.1°F | Resp 16 | Ht 63.5 in | Wt 213.0 lb

## 2018-06-10 DIAGNOSIS — Z1211 Encounter for screening for malignant neoplasm of colon: Secondary | ICD-10-CM

## 2018-06-10 DIAGNOSIS — Z0000 Encounter for general adult medical examination without abnormal findings: Secondary | ICD-10-CM | POA: Diagnosis not present

## 2018-06-10 DIAGNOSIS — D500 Iron deficiency anemia secondary to blood loss (chronic): Secondary | ICD-10-CM | POA: Diagnosis not present

## 2018-06-10 DIAGNOSIS — Z800 Family history of malignant neoplasm of digestive organs: Secondary | ICD-10-CM | POA: Diagnosis not present

## 2018-06-10 DIAGNOSIS — Z1212 Encounter for screening for malignant neoplasm of rectum: Secondary | ICD-10-CM

## 2018-06-10 HISTORY — DX: Family history of malignant neoplasm of digestive organs: Z80.0

## 2018-06-10 LAB — CBC WITH DIFFERENTIAL/PLATELET
Basophils Absolute: 0 10*3/uL (ref 0.0–0.1)
Basophils Relative: 0.4 % (ref 0.0–3.0)
Eosinophils Absolute: 0 10*3/uL (ref 0.0–0.7)
Eosinophils Relative: 0.2 % (ref 0.0–5.0)
HCT: 32.8 % — ABNORMAL LOW (ref 36.0–46.0)
Hemoglobin: 10.4 g/dL — ABNORMAL LOW (ref 12.0–15.0)
Lymphocytes Relative: 26 % (ref 12.0–46.0)
Lymphs Abs: 1.9 10*3/uL (ref 0.7–4.0)
MCHC: 31.6 g/dL (ref 30.0–36.0)
MCV: 63.7 fl — ABNORMAL LOW (ref 78.0–100.0)
Monocytes Absolute: 0.4 10*3/uL (ref 0.1–1.0)
Monocytes Relative: 5.8 % (ref 3.0–12.0)
Neutro Abs: 5.1 10*3/uL (ref 1.4–7.7)
Neutrophils Relative %: 67.6 % (ref 43.0–77.0)
Platelets: 318 10*3/uL (ref 150.0–400.0)
RBC: 5.15 Mil/uL — ABNORMAL HIGH (ref 3.87–5.11)
RDW: 19.4 % — ABNORMAL HIGH (ref 11.5–15.5)
WBC: 7.5 10*3/uL (ref 4.0–10.5)

## 2018-06-10 LAB — COMPREHENSIVE METABOLIC PANEL
ALT: 8 U/L (ref 0–35)
AST: 15 U/L (ref 0–37)
Albumin: 4.1 g/dL (ref 3.5–5.2)
Alkaline Phosphatase: 73 U/L (ref 39–117)
BUN: 9 mg/dL (ref 6–23)
CO2: 26 mEq/L (ref 19–32)
Calcium: 9.2 mg/dL (ref 8.4–10.5)
Chloride: 103 mEq/L (ref 96–112)
Creatinine, Ser: 0.84 mg/dL (ref 0.40–1.20)
GFR: 88.1 mL/min (ref >60.00)
Glucose, Bld: 119 mg/dL — ABNORMAL HIGH (ref 70–99)
Potassium: 3.5 mEq/L (ref 3.5–5.1)
Sodium: 139 mEq/L (ref 135–145)
Total Bilirubin: 0.4 mg/dL (ref 0.2–1.2)
Total Protein: 7.4 g/dL (ref 6.0–8.3)

## 2018-06-10 LAB — LIPID PANEL
Cholesterol: 213 mg/dL — ABNORMAL HIGH (ref 0–200)
HDL: 39.8 mg/dL (ref >39.00)
LDL Cholesterol: 148 mg/dL — ABNORMAL HIGH (ref 0–99)
NonHDL: 173.6
Total CHOL/HDL Ratio: 5
Triglycerides: 126 mg/dL (ref 0.0–149.0)
VLDL: 25.2 mg/dL (ref 0.0–40.0)

## 2018-06-10 NOTE — Progress Notes (Signed)
Subjective  Chief Complaint  Patient presents with  . Establish Care    Relocated, wants to have fasting blood work    HPI: Michele Roman is a 48 y.o. female who presents to [place] at [facility] today for a Female Wellness Visit.  Here to establish care. I reviewed old records. Last cpe 04/2017.  Wellness Visit: annual visit with health maintenance review and exam without Pap   HM: declines flu vaccine. Feels fine. No concerns. Diet: regular. Little exercise. Non-fasting today.  Has h/o chronic anemia. Prefers not to take iron. Feels well. Regular menses.   Sees Gyn for female wellness. Due again in may. Reports normal pap 07/2017; has remote h/o ascus pap 2013 with normal f/u.   Dad was dxd with colon cancer at age 47; pt had negative colonoscopy at age 58. Due for next.   Assessment  1. Annual physical exam   2. Iron deficiency anemia due to chronic blood loss   3. Family history of colon cancer   4. Screening for colorectal cancer      Plan  Female Wellness Visit:  Age appropriate Health Maintenance and Prevention measures were discussed with patient. Included topics are cancer screening recommendations, ways to keep healthy (see AVS) including dietary and exercise recommendations, regular eye and dental care, use of seat belts, and avoidance of moderate alcohol use and tobacco use.   BMI: discussed patient's BMI and encouraged positive lifestyle modifications to help get to or maintain a target BMI.  HM needs and immunizations were addressed and ordered. See below for orders. See HM and immunization section for updates.  Routine labs and screening tests ordered including cmp, cbc and lipids where appropriate.  Discussed recommendations regarding Vit D and calcium supplementation (see AVS)  Will check iron levels.   Refer to GI for colonoscopy due to + FH of colon cancer.   Follow up: Return in about 1 year (around 06/10/2019) for complete  physical.   Orders Placed This Encounter  Procedures  . CBC with Differential/Platelet  . Comprehensive metabolic panel  . Lipid panel  . Iron, TIBC and Ferritin Panel  . Ambulatory referral to Gastroenterology   No orders of the defined types were placed in this encounter.    Lifestyle: Body mass index is 37.14 kg/m. Wt Readings from Last 3 Encounters:  06/10/18 213 lb (96.6 kg)  07/16/17 221 lb (100.2 kg)  04/22/17 218 lb (98.9 kg)     Patient Active Problem List   Diagnosis Date Noted  . Family history of colon cancer 06/10/2018    Father age 78   . Anemia, iron deficiency 06/25/2013  . Obesity (BMI 30-39.9) 01/14/2013  . Fatty liver 04/15/2009    Qualifier: Diagnosis of  By: Madilyn Fireman MD, [facility] Maintenance  Topic Date Due  . HIV Screening  11/17/1986  . PAP SMEAR-Modifier  07/31/2020  . TETANUS/TDAP  04/16/2026  . INFLUENZA VACCINE  Discontinued   Immunization History  Administered Date(s) Administered  . Hep A / Hep B 08/02/2011, 11/12/2012, 06/25/2013  . Td 12/21/2005  . Tdap 04/16/2016   We updated and reviewed the patient's past history in detail and it is documented below. Allergies: Patient is allergic to hydrocodone. Past Medical History Patient  has a past medical history of Family history of colon cancer (06/10/2018) and H/O colonoscopy (03/01/09). Past Surgical History Patient  has a past surgical history that includes Cesarean section (1999); Appendectomy (1997); Exploratory laparotomy (  1999); and Right foot surgery (2008, 2011). Family History: Patient family history includes Autoimmune disease in her sister; Breast cancer in her paternal aunt; Colon cancer in her father; Diabetes in her mother; Healthy in her daughter and daughter; Heart defect in her father; Heart disease (age of onset: 68) in her mother; Hyperlipidemia in her mother; Hypertension in her brother and mother. Social History:  Patient  reports that she has never  smoked. She has never used smokeless tobacco. She reports current alcohol use. She reports that she does not use drugs.  Review of Systems: Constitutional: negative for fever or malaise Ophthalmic: negative for photophobia, double vision or loss of vision Cardiovascular: negative for chest pain, dyspnea on exertion, or new LE swelling Respiratory: negative for SOB or persistent cough Gastrointestinal: negative for abdominal pain, change in bowel habits or melena Genitourinary: negative for dysuria or gross hematuria, no abnormal uterine bleeding or disharge Musculoskeletal: negative for new gait disturbance or muscular weakness Integumentary: negative for new or persistent rashes, no breast lumps Neurological: negative for TIA or stroke symptoms Psychiatric: negative for SI or delusions Allergic/Immunologic: negative for hives  Patient Care Team    Relationship Specialty Notifications Start End  Leamon Arnt, MD PCP - General Family Medicine  06/10/18     Objective  Vitals: BP 120/78   Pulse 92   Temp 98.1 F (36.7 C) (Oral)   Resp 16   Ht 5' 3.5" (1.613 m)   Wt 213 lb (96.6 kg)   LMP 05/12/2018   SpO2 98%   BMI 37.14 kg/m  General:  Well developed, well nourished, no acute distress  Psych:  Alert and orientedx3,normal mood and affect HEENT:  Normocephalic, atraumatic, non-icteric sclera, PERRL, oropharynx is clear without mass or exudate, supple neck without adenopathy, mass or thyromegaly Cardiovascular:  Normal S1, S2, RRR without gallop, rub or murmur, nondisplaced PMI Respiratory:  Good breath sounds bilaterally, CTAB with normal respiratory effort Gastrointestinal: normal bowel sounds, soft, non-tender, no noted masses. No HSM MSK: no deformities, contusions. Joints are without erythema or swelling. Spine and CVA region are nontender Skin:  Warm, no rashes or suspicious lesions noted Neurologic:    Mental status is normal. CN 2-11 are normal. Gross motor and sensory  exams are normal. Normal gait. No tremor   Commons side effects, risks, benefits, and alternatives for medications and treatment plan prescribed today were discussed, and the patient expressed understanding of the given instructions. Patient is instructed to call or message via MyChart if he/she has any questions or concerns regarding our treatment plan. No barriers to understanding were identified. We discussed Red Flag symptoms and signs in detail. Patient expressed understanding regarding what to do in case of urgent or emergency type symptoms.   Medication list was reconciled, printed and provided to the patient in AVS. Patient instructions and summary information was reviewed with the patient as documented in the AVS. This note was prepared with assistance of Dragon voice recognition software. Occasional wrong-word or sound-a-like substitutions may have occurred due to the inherent limitations of voice recognition software

## 2018-06-10 NOTE — Patient Instructions (Signed)
Please return in 12 months for your annual complete physical; please come fasting.  I will release your lab results to you on your MyChart account with further instructions. Please reply with any questions.    It was a pleasure meeting you today! Thank you for choosing [place] to meet your healthcare needs! I truly look forward to working with you. If you have any questions or concerns, please send me a message via Mychart or call the office at [phone].  Please do these things to maintain good health!   Exercise at least 30-45 minutes a day,  4-5 days a week.   Eat a low-fat diet with lots of fruits and vegetables, up to 7-9 servings per day.  Drink plenty of water daily. Try to drink 8 8oz glasses per day.  Seatbelts can save your life. Always wear your seatbelt.  Place Smoke Detectors on every level of your home and check batteries every year.  Schedule an appointment with an eye doctor for an eye exam every 1-2 years  Safe sex - use condoms to protect yourself from STDs if you could be exposed to these types of infections. Use birth control if you do not want to become pregnant and are sexually active.  Avoid heavy alcohol use. If you drink, keep it to less than 2 drinks/day and not every day.  Birdsong.  Choose someone you trust that could speak for you if you became unable to speak for yourself.  Depression is common in our stressful world.If you're feeling down or losing interest in things you normally enjoy, please come in for a visit.  If anyone is threatening or hurting you, please get help. Physical or Emotional Violence is never OK.

## 2018-06-11 ENCOUNTER — Encounter: Payer: Self-pay | Admitting: *Deleted

## 2018-06-11 LAB — IRON,TIBC AND FERRITIN PANEL
%SAT: 8 % (calc) — ABNORMAL LOW (ref 16–45)
Ferritin: 4 ng/mL — ABNORMAL LOW (ref 16–232)
Iron: 30 ug/dL — ABNORMAL LOW (ref 40–190)
TIBC: 384 mcg/dL (calc) (ref 250–450)

## 2020-01-28 ENCOUNTER — Other Ambulatory Visit: Payer: Self-pay

## 2020-01-28 ENCOUNTER — Ambulatory Visit (INDEPENDENT_AMBULATORY_CARE_PROVIDER_SITE_OTHER): Payer: 59 | Admitting: Family Medicine

## 2020-01-28 ENCOUNTER — Encounter: Payer: Self-pay | Admitting: Family Medicine

## 2020-01-28 VITALS — BP 138/100 | HR 103 | Temp 98.1°F | Wt 241.8 lb

## 2020-01-28 DIAGNOSIS — I10 Essential (primary) hypertension: Secondary | ICD-10-CM | POA: Diagnosis not present

## 2020-01-28 DIAGNOSIS — Z800 Family history of malignant neoplasm of digestive organs: Secondary | ICD-10-CM

## 2020-01-28 DIAGNOSIS — Z0000 Encounter for general adult medical examination without abnormal findings: Secondary | ICD-10-CM

## 2020-01-28 DIAGNOSIS — D500 Iron deficiency anemia secondary to blood loss (chronic): Secondary | ICD-10-CM

## 2020-01-28 DIAGNOSIS — K760 Fatty (change of) liver, not elsewhere classified: Secondary | ICD-10-CM

## 2020-01-28 DIAGNOSIS — N920 Excessive and frequent menstruation with regular cycle: Secondary | ICD-10-CM

## 2020-01-28 DIAGNOSIS — R600 Localized edema: Secondary | ICD-10-CM

## 2020-01-28 DIAGNOSIS — R9431 Abnormal electrocardiogram [ECG] [EKG]: Secondary | ICD-10-CM

## 2020-01-28 MED ORDER — POTASSIUM CHLORIDE CRYS ER 20 MEQ PO TBCR: 20.0000 meq | EXTENDED_RELEASE_TABLET | Freq: Every day | ORAL | 0 refills | Status: DC | PRN

## 2020-01-28 MED ORDER — METOPROLOL SUCCINATE ER 50 MG PO TB24: 50.0000 mg | ORAL_TABLET | Freq: Every day | ORAL | 3 refills | Status: DC

## 2020-01-28 MED ORDER — FUROSEMIDE 20 MG PO TABS: 20.0000 mg | ORAL_TABLET | Freq: Every day | ORAL | 0 refills | Status: DC | PRN

## 2020-01-28 NOTE — Patient Instructions (Signed)
Please return in 4 weeks for recheck.   I will release your lab results to you on your MyChart account with further instructions. Please reply with any questions.  I'd like you to get an ultrasound of your uterus and your heart. We will call you with appointments.   Please eat a low salt diet and take the medications as directed.   If you have any questions or concerns, please don't hesitate to send me a message via MyChart or call the office at [phone]. Thank you for visiting with [place] today! It's our pleasure caring for you.   Hypertension, Adult High blood pressure (hypertension) is when the force of blood pumping through the arteries is too strong. The arteries are the blood vessels that carry blood from the heart throughout the body. Hypertension forces the heart to work harder to pump blood and may cause arteries to become narrow or stiff. Untreated or uncontrolled hypertension can cause a heart attack, heart failure, a stroke, kidney disease, and other problems. A blood pressure reading consists of a higher number over a lower number. Ideally, your blood pressure should be below 120/80. The first ("top") number is called the systolic pressure. It is a measure of the pressure in your arteries as your heart beats. The second ("bottom") number is called the diastolic pressure. It is a measure of the pressure in your arteries as the heart relaxes. What are the causes? The exact cause of this condition is not known. There are some conditions that result in or are related to high blood pressure. What increases the risk? Some risk factors for high blood pressure are under your control. The following factors may make you more likely to develop this condition:  Smoking.  Having type 2 diabetes mellitus, high cholesterol, or both.  Not getting enough exercise or physical activity.  Being overweight.  Having too much fat, sugar, calories, or salt (sodium) in your diet.  Drinking too much  alcohol. Some risk factors for high blood pressure may be difficult or impossible to change. Some of these factors include:  Having chronic kidney disease.  Having a family history of high blood pressure.  Age. Risk increases with age.  Race. You may be at higher risk if you are African American.  Gender. Men are at higher risk than women before age 78. After age 52, women are at higher risk than men.  Having obstructive sleep apnea.  Stress. What are the signs or symptoms? High blood pressure may not cause symptoms. Very high blood pressure (hypertensive crisis) may cause:  Headache.  Anxiety.  Shortness of breath.  Nosebleed.  Nausea and vomiting.  Vision changes.  Severe chest pain.  Seizures. How is this diagnosed? This condition is diagnosed by measuring your blood pressure while you are seated, with your arm resting on a flat surface, your legs uncrossed, and your feet flat on the floor. The cuff of the blood pressure monitor will be placed directly against the skin of your upper arm at the level of your heart. It should be measured at least twice using the same arm. Certain conditions can cause a difference in blood pressure between your right and left arms. Certain factors can cause blood pressure readings to be lower or higher than normal for a short period of time:  When your blood pressure is higher when you are in a health care provider's office than when you are at home, this is called white coat hypertension. Most people with this condition  do not need medicines.  When your blood pressure is higher at home than when you are in a health care provider's office, this is called masked hypertension. Most people with this condition may need medicines to control blood pressure. If you have a high blood pressure reading during one visit or you have normal blood pressure with other risk factors, you may be asked to:  Return on a different day to have your blood  pressure checked again.  Monitor your blood pressure at home for 1 week or longer. If you are diagnosed with hypertension, you may have other blood or imaging tests to help your health care provider understand your overall risk for other conditions. How is this treated? This condition is treated by making healthy lifestyle changes, such as eating healthy foods, exercising more, and reducing your alcohol intake. Your health care provider may prescribe medicine if lifestyle changes are not enough to get your blood pressure under control, and if:  Your systolic blood pressure is above 130.  Your diastolic blood pressure is above 80. Your personal target blood pressure may vary depending on your medical conditions, your age, and other factors. Follow these instructions at home: Eating and drinking   Eat a diet that is high in fiber and potassium, and low in sodium, added sugar, and fat. An example eating plan is called the DASH (Dietary Approaches to Stop Hypertension) diet. To eat this way: ? Eat plenty of fresh fruits and vegetables. Try to fill one half of your plate at each meal with fruits and vegetables. ? Eat whole grains, such as whole-wheat pasta, brown rice, or whole-grain bread. Fill about one fourth of your plate with whole grains. ? Eat or drink low-fat dairy products, such as skim milk or low-fat yogurt. ? Avoid fatty cuts of meat, processed or cured meats, and poultry with skin. Fill about one fourth of your plate with lean proteins, such as fish, chicken without skin, beans, eggs, or tofu. ? Avoid pre-made and processed foods. These tend to be higher in sodium, added sugar, and fat.  Reduce your daily sodium intake. Most people with hypertension should eat less than 1,500 mg of sodium a day.  Do not drink alcohol if: ? Your health care provider tells you not to drink. ? You are pregnant, may be pregnant, or are planning to become pregnant.  If you drink alcohol: ? Limit how  much you use to:  0-1 drink a day for women.  0-2 drinks a day for men. ? Be aware of how much alcohol is in your drink. In the [place] one drink equals one 12 oz bottle of beer (355 mL), one 5 oz glass of wine (148 mL), or one 1 oz glass of hard liquor (44 mL). Lifestyle   Work with your health care provider to maintain a healthy body weight or to lose weight. Ask what an ideal weight is for you.  Get at least 30 minutes of exercise most days of the week. Activities may include walking, swimming, or biking.  Include exercise to strengthen your muscles (resistance exercise), such as Pilates or lifting weights, as part of your weekly exercise routine. Try to do these types of exercises for 30 minutes at least 3 days a week.  Do not use any products that contain nicotine or tobacco, such as cigarettes, e-cigarettes, and chewing tobacco. If you need help quitting, ask your health care provider.  Monitor your blood pressure at home as told by your health  care provider.  Keep all follow-up visits as told by your health care provider. This is important. Medicines  Take over-the-counter and prescription medicines only as told by your health care provider. Follow directions carefully. Blood pressure medicines must be taken as prescribed.  Do not skip doses of blood pressure medicine. Doing this puts you at risk for problems and can make the medicine less effective.  Ask your health care provider about side effects or reactions to medicines that you should watch for. Contact a health care provider if you:  Think you are having a reaction to a medicine you are taking.  Have headaches that keep coming back (recurring).  Feel dizzy.  Have swelling in your ankles.  Have trouble with your vision. Get help right away if you:  Develop a severe headache or confusion.  Have unusual weakness or numbness.  Feel faint.  Have severe pain in your chest or abdomen.  Vomit repeatedly.  Have  trouble breathing. Summary  Hypertension is when the force of blood pumping through your arteries is too strong. If this condition is not controlled, it may put you at risk for serious complications.  Your personal target blood pressure may vary depending on your medical conditions, your age, and other factors. For most people, a normal blood pressure is less than 120/80.  Hypertension is treated with lifestyle changes, medicines, or a combination of both. Lifestyle changes include losing weight, eating a healthy, low-sodium diet, exercising more, and limiting alcohol. This information is not intended to replace advice given to you by your health care provider. Make sure you discuss any questions you have with your health care provider. Document Revised: 11/27/2017 Document Reviewed: 11/27/2017 Elsevier Patient Education  [place].   Low-Sodium Eating Plan Sodium, which is an element that makes up salt, helps you maintain a healthy balance of fluids in your body. Too much sodium can increase your blood pressure and cause fluid and waste to be held in your body. Your health care provider or dietitian may recommend following this plan if you have high blood pressure (hypertension), kidney disease, liver disease, or heart failure. Eating less sodium can help lower your blood pressure, reduce swelling, and protect your heart, liver, and kidneys. What are tips for following this plan? General guidelines  Most people on this plan should limit their sodium intake to 1,500-2,000 mg (milligrams) of sodium each day. Reading food labels   The Nutrition Facts label lists the amount of sodium in one serving of the food. If you eat more than one serving, you must multiply the listed amount of sodium by the number of servings.  Choose foods with less than 140 mg of sodium per serving.  Avoid foods with 300 mg of sodium or more per serving. Shopping  Look for lower-sodium products, often  labeled as "low-sodium" or "no salt added."  Always check the sodium content even if foods are labeled as "unsalted" or "no salt added".  Buy fresh foods. ? Avoid canned foods and premade or frozen meals. ? Avoid canned, cured, or processed meats  Buy breads that have less than 80 mg of sodium per slice. Cooking  Eat more home-cooked food and less restaurant, buffet, and fast food.  Avoid adding salt when cooking. Use salt-free seasonings or herbs instead of table salt or sea salt. Check with your health care provider or pharmacist before using salt substitutes.  Cook with plant-based oils, such as canola, sunflower, or olive oil. Meal planning  When  eating at a restaurant, ask that your food be prepared with less salt or no salt, if possible.  Avoid foods that contain MSG (monosodium glutamate). MSG is sometimes added to [place] food, bouillon, and some canned foods. What foods are recommended? The items listed may not be a complete list. Talk with your dietitian about what dietary choices are best for you. Grains Low-sodium cereals, including oats, puffed wheat and rice, and shredded wheat. Low-sodium crackers. Unsalted rice. Unsalted pasta. Low-sodium bread. Whole-grain breads and whole-grain pasta. Vegetables Fresh or frozen vegetables. "No salt added" canned vegetables. "No salt added" tomato sauce and paste. Low-sodium or reduced-sodium tomato and vegetable juice. Fruits Fresh, frozen, or canned fruit. Fruit juice. Meats and other protein foods Fresh or frozen (no salt added) meat, poultry, seafood, and fish. Low-sodium canned tuna and salmon. Unsalted nuts. Dried peas, beans, and lentils without added salt. Unsalted canned beans. Eggs. Unsalted nut butters. Dairy Milk. Soy milk. Cheese that is naturally low in sodium, such as ricotta cheese, fresh mozzarella, or Swiss cheese Low-sodium or reduced-sodium cheese. Cream cheese. Yogurt. Fats and oils Unsalted butter. Unsalted  margarine with no trans fat. Vegetable oils such as canola or olive oils. Seasonings and other foods Fresh and dried herbs and spices. Salt-free seasonings. Low-sodium mustard and ketchup. Sodium-free salad dressing. Sodium-free light mayonnaise. Fresh or refrigerated horseradish. Lemon juice. Vinegar. Homemade, reduced-sodium, or low-sodium soups. Unsalted popcorn and pretzels. Low-salt or salt-free chips. What foods are not recommended? The items listed may not be a complete list. Talk with your dietitian about what dietary choices are best for you. Grains Instant hot cereals. Bread stuffing, pancake, and biscuit mixes. Croutons. Seasoned rice or pasta mixes. Noodle soup cups. Boxed or frozen macaroni and cheese. Regular salted crackers. Self-rising flour. Vegetables Sauerkraut, pickled vegetables, and relishes. Olives. [place] fries. Onion rings. Regular canned vegetables (not low-sodium or reduced-sodium). Regular canned tomato sauce and paste (not low-sodium or reduced-sodium). Regular tomato and vegetable juice (not low-sodium or reduced-sodium). Frozen vegetables in sauces. Meats and other protein foods Meat or fish that is salted, canned, smoked, spiced, or pickled. Bacon, ham, sausage, hotdogs, corned beef, chipped beef, packaged lunch meats, salt pork, jerky, pickled herring, anchovies, regular canned tuna, sardines, salted nuts. Dairy Processed cheese and cheese spreads. Cheese curds. Blue cheese. Feta cheese. String cheese. Regular cottage cheese. Buttermilk. Canned milk. Fats and oils Salted butter. Regular margarine. Ghee. Bacon fat. Seasonings and other foods Onion salt, garlic salt, seasoned salt, table salt, and sea salt. Canned and packaged gravies. Worcestershire sauce. Tartar sauce. Barbecue sauce. Teriyaki sauce. Soy sauce, including reduced-sodium. Steak sauce. Fish sauce. Oyster sauce. Cocktail sauce. Horseradish that you find on the shelf. Regular ketchup and mustard. Meat  flavorings and tenderizers. Bouillon cubes. Hot sauce and Tabasco sauce. Premade or packaged marinades. Premade or packaged taco seasonings. Relishes. Regular salad dressings. Salsa. Potato and tortilla chips. Corn chips and puffs. Salted popcorn and pretzels. Canned or dried soups. Pizza. Frozen entrees and pot pies. Summary  Eating less sodium can help lower your blood pressure, reduce swelling, and protect your heart, liver, and kidneys.  Most people on this plan should limit their sodium intake to 1,500-2,000 mg (milligrams) of sodium each day.  Canned, boxed, and frozen foods are high in sodium. Restaurant foods, fast foods, and pizza are also very high in sodium. You also get sodium by adding salt to food.  Try to cook at home, eat more fresh fruits and vegetables, and eat less fast food, canned, processed,  or prepared foods. This information is not intended to replace advice given to you by your health care provider. Make sure you discuss any questions you have with your health care provider. Document Revised: 03/01/2017 Document Reviewed: 03/12/2016 Elsevier Patient Education  2020 [employer].

## 2020-01-28 NOTE — Progress Notes (Signed)
Subjective  Chief Complaint  Patient presents with  . Annual Exam    fasting  . Leg Swelling    bilateral, noticed after eating red meat two weeks ago    HPI: Michele Roman is a 49 y.o. female who presents to [place] [facility] today for a Female Wellness Visit. She also has the concerns and/or needs as listed above in the chief complaint. These will be addressed in addition to the Health Maintenance Visit.   Wellness Visit: annual visit with health maintenance review and exam without Pap   HM: declines vaccines. Screens up to date. Anxious today since worried about her leg swelling Chronic disease f/u and/or acute problem visit: (deemed necessary to be done in addition to the wellness visit):  Acute onset of B LE about 3-4 weeks ago, worse after high sodium meal and [place] mike's a few weeks ago. Pt reports moderate edema that is now starting to improve slightly with elevation. She denies cp or sob but has been a bit fatigued. Last visit was > 1 year ago. Has long standing h/o edema due to menorrhagia but never had f/u for treatment options. Menses last 7 days. She has gained weight. bp is elevated w/o h/o htn. No DM or HLD. + h/o fatty liver. No upper abdominal pain. No pelvic pain with menses.   Assessment  1. Annual physical exam   2. Fatty liver   3. Morbid obesity ([place])   4. Iron deficiency anemia due to chronic blood loss   5. Family history of colon cancer   6. Essential hypertension   7. Menorrhagia with regular cycle   8. Bilateral lower extremity edema      Plan  Female Wellness Visit:  Age appropriate Health Maintenance and Prevention measures were discussed with patient. Included topics are cancer screening recommendations, ways to keep healthy (see AVS) including dietary and exercise recommendations, regular eye and dental care, use of seat belts, and avoidance of moderate alcohol use and tobacco use.   BMI: discussed patient's BMI and  encouraged positive lifestyle modifications to help get to or maintain a target BMI.  HM needs and immunizations were addressed and ordered. See below for orders. See HM and immunization section for updates.  Routine labs and screening tests ordered including cmp, cbc and lipids where appropriate.  Discussed recommendations regarding Vit D and calcium supplementation (see AVS)  Chronic disease management visit and/or acute problem visit:  New onset LE edema: broad differential: EKG shows old septal Q wave. start eval with echocardiogram, TVUS, labs. Start lasix. Limit sodium. Close f/u recommended. To er for cp  HTN, new onset: start BB.   Menorrhagia and h/o IDA: recheck levels today. Education given. Pt is hesitant for treatment.   Check liver and renal function.    Follow up: No follow-ups on file.  Orders Placed This Encounter  Procedures  . US Pelvis Complete  . CBC with Differential/Platelet  . COMPLETE METABOLIC PANEL WITH GFR  . TSH  . Lipid panel  . Iron, TIBC and Ferritin Panel  . EKG 12-Lead   No orders of the defined types were placed in this encounter.     Lifestyle: Body mass index is 42.16 kg/m. Wt Readings from Last 3 Encounters:  01/28/20 241 lb 12.8 oz (109.7 kg)  06/10/18 213 lb (96.6 kg)  07/16/17 221 lb (100.2 kg)   D  Patient Active Problem List   Diagnosis Date Noted  . Family history of  colon cancer 06/10/2018    Father age 34   . Anemia, iron deficiency 06/25/2013  . Obesity (BMI 30-39.9) 01/14/2013  . Fatty liver 04/15/2009    Qualifier: Diagnosis of  By: Madilyn Fireman MD, [facility] Maintenance  Topic Date Due  . HIV Screening  Never done  . COVID-19 Vaccine (1) 02/13/2020 (Originally [birth year]1983)  . PAP SMEAR-Modifier  07/31/2020  . TETANUS/TDAP  04/16/2026  . Hepatitis C Screening  Completed  . INFLUENZA VACCINE  Discontinued   Immunization History  Administered Date(s) Administered  . Hep A / Hep B 08/02/2011,  11/12/2012, 06/25/2013  . Td 12/21/2005  . Tdap 04/16/2016   We updated and reviewed the patient's past history in detail and it is documented below. Allergies: Patient is allergic to hydrocodone. Past Medical History Patient  has a past medical history of Family history of colon cancer (06/10/2018) and H/O colonoscopy (03/01/09). Past Surgical History Patient  has a past surgical history that includes Cesarean section (1999); Appendectomy (1997); Exploratory laparotomy (1999); and Right foot surgery (2008, 2011). Family History: Patient family history includes Autoimmune disease in her sister; Breast cancer in her paternal aunt; Colon cancer in her father; Diabetes in her mother; Healthy in her daughter and daughter; Heart defect in her father; Heart disease (age of onset: 20) in her mother; Hyperlipidemia in her mother; Hypertension in her brother and mother. Social History:  Patient  reports that she has never smoked. She has never used smokeless tobacco. She reports current alcohol use. She reports that she does not use drugs.  Review of Systems: Constitutional: negative for fever or malaise Ophthalmic: negative for photophobia, double vision or loss of vision Cardiovascular: negative for chest pain, dyspnea on exertion, or new LE swelling Respiratory: negative for SOB or persistent cough Gastrointestinal: negative for abdominal pain, change in bowel habits or melena Genitourinary: negative for dysuria or gross hematuria, no abnormal uterine bleeding or disharge Musculoskeletal: negative for new gait disturbance or muscular weakness Integumentary: negative for new or persistent rashes, no breast lumps Neurological: negative for TIA or stroke symptoms Psychiatric: negative for SI or delusions Allergic/Immunologic: negative for hives  Patient Care Team    Relationship Specialty Notifications Start End  Leamon Arnt, MD PCP - General Family Medicine  06/10/18     Objective    Vitals: BP (!) 138/100   Pulse (!) 103   Temp 98.1 F (36.7 C) (Temporal)   Wt 241 lb 12.8 oz (109.7 kg)   SpO2 98%   BMI 42.16 kg/m  General:  Well developed, well nourished, no acute distress  Psych:  Alert and orientedx3,normal mood and affect HEENT:  Normocephalic, atraumatic, non-icteric sclera,  supple neck without adenopathy, mass or thyromegaly Cardiovascular:  Normal S1, S2, RRR without gallop, rub or murmur Respiratory:  Good breath sounds bilaterally, CTAB with normal respiratory effort Gastrointestinal: normal bowel sounds, soft, non-tender, no noted masses. No HSM MSK: no deformities, contusions. Joints are without erythema or swelling.bilateral 2+ pitting edema to knees Skin:  Warm, no rashes or suspicious lesions noted Neurologic:    Mental status is normal. CN 2-11 are normal. Gross motor and sensory exams are normal. Normal gait. No tremor    Commons side effects, risks, benefits, and alternatives for medications and treatment plan prescribed today were discussed, and the patient expressed understanding of the given instructions. Patient is instructed to call or message via MyChart if he/she has any questions or concerns regarding our treatment plan. No barriers  to understanding were identified. We discussed Red Flag symptoms and signs in detail. Patient expressed understanding regarding what to do in case of urgent or emergency type symptoms.   Medication list was reconciled, printed and provided to the patient in AVS. Patient instructions and summary information was reviewed with the patient as documented in the AVS. This note was prepared with assistance of Dragon voice recognition software. Occasional wrong-word or sound-a-like substitutions may have occurred due to the inherent limitations of voice recognition software  This visit occurred during the SARS-CoV-2 public health emergency.  Safety protocols were in place, including screening questions prior to the visit,  additional usage of staff PPE, and extensive cleaning of exam room while observing appropriate contact time as indicated for disinfecting solutions.

## 2020-01-29 ENCOUNTER — Telehealth: Payer: Self-pay

## 2020-01-29 LAB — COMPLETE METABOLIC PANEL WITH GFR
AST: 15 U/L (ref 10–35)
Albumin: 3.8 g/dL (ref 3.6–5.1)
Alkaline phosphatase (APISO): 54 U/L (ref 31–125)
Calcium: 8.8 mg/dL (ref 8.6–10.2)
GFR, Est African American: 104 mL/min/{1.73_m2} (ref >=60)
Glucose, Bld: 100 mg/dL — ABNORMAL HIGH (ref 65–99)
Total Bilirubin: 0.5 mg/dL (ref 0.2–1.2)

## 2020-01-29 LAB — CBC WITH DIFFERENTIAL/PLATELET
Eosinophils Absolute: 7 cells/uL — ABNORMAL LOW (ref 15–500)
Platelets: 353 10*3/uL (ref 140–400)

## 2020-01-29 LAB — TSH: TSH: 1.75 mIU/L

## 2020-01-29 NOTE — Telephone Encounter (Signed)
Patient is calling in saying she would like to speak with Katie, and wouldn't give any details.

## 2020-01-29 NOTE — Progress Notes (Signed)
Nsr with short p -r interval and non specific t wave changes. No acute st elevation or q waves. No comparison

## 2020-02-01 LAB — COMPLETE METABOLIC PANEL WITH GFR
AG Ratio: 1.4 (calc) (ref 1.0–2.5)
ALT: 9 U/L (ref 6–29)
BUN: 7 mg/dL (ref 7–25)
CO2: 25 mmol/L (ref 20–32)
Chloride: 106 mmol/L (ref 98–110)
Creat: 0.78 mg/dL (ref 0.50–1.10)
GFR, Est Non African American: 90 mL/min/{1.73_m2} (ref >=60)
Globulin: 2.7 g/dL (calc) (ref 1.9–3.7)
Potassium: 3.7 mmol/L (ref 3.5–5.3)
Sodium: 139 mmol/L (ref 135–146)
Total Protein: 6.5 g/dL (ref 6.1–8.1)

## 2020-02-01 LAB — IRON,TIBC AND FERRITIN PANEL
%SAT: 6 % (calc) — ABNORMAL LOW (ref 16–45)
Ferritin: 3 ng/mL — ABNORMAL LOW (ref 16–232)
Iron: 23 ug/dL — ABNORMAL LOW (ref 40–190)
TIBC: 389 mcg/dL (calc) (ref 250–450)

## 2020-02-01 LAB — LIPID PANEL
Cholesterol: 203 mg/dL — ABNORMAL HIGH (ref <200)
HDL: 34 mg/dL — ABNORMAL LOW (ref >=50)
LDL Cholesterol (Calc): 138 mg/dL (calc) — ABNORMAL HIGH
Non-HDL Cholesterol (Calc): 169 mg/dL (calc) — ABNORMAL HIGH (ref <130)
Total CHOL/HDL Ratio: 6 (calc) — ABNORMAL HIGH (ref <5.0)
Triglycerides: 179 mg/dL — ABNORMAL HIGH (ref <150)

## 2020-02-01 LAB — CBC WITH DIFFERENTIAL/PLATELET
Absolute Monocytes: 469 cells/uL (ref 200–950)
Basophils Absolute: 28 cells/uL (ref 0–200)
Basophils Relative: 0.4 %
Eosinophils Relative: 0.1 %
HCT: 34.1 % — ABNORMAL LOW (ref 35.0–45.0)
Hemoglobin: 9.7 g/dL — ABNORMAL LOW (ref 11.7–15.5)
Lymphs Abs: 2584 cells/uL (ref 850–3900)
MCH: 18.4 pg — ABNORMAL LOW (ref 27.0–33.0)
MCHC: 28.4 g/dL — ABNORMAL LOW (ref 32.0–36.0)
MCV: 64.6 fL — ABNORMAL LOW (ref 80.0–100.0)
MPV: 9.8 fL (ref 7.5–12.5)
Monocytes Relative: 6.6 %
Neutro Abs: 4012 cells/uL (ref 1500–7800)
Neutrophils Relative %: 56.5 %
RBC: 5.28 10*6/uL — ABNORMAL HIGH (ref 3.80–5.10)
RDW: 21.8 % — ABNORMAL HIGH (ref 11.0–15.0)
Total Lymphocyte: 36.4 %
WBC: 7.1 10*3/uL (ref 3.8–10.8)

## 2020-02-01 LAB — BRAIN NATRIURETIC PEPTIDE

## 2020-02-02 ENCOUNTER — Other Ambulatory Visit: Payer: Self-pay

## 2020-02-02 DIAGNOSIS — D500 Iron deficiency anemia secondary to blood loss (chronic): Secondary | ICD-10-CM

## 2020-02-02 NOTE — Telephone Encounter (Signed)
Patient is agreeable to starting iron infusions.

## 2020-02-02 NOTE — Telephone Encounter (Signed)
Please refer to hematology: iron deficiency anemia; request eval for iron infusions. Doesn't tolerate oral iron.

## 2020-02-02 NOTE — Telephone Encounter (Signed)
noted 

## 2020-02-02 NOTE — Telephone Encounter (Signed)
Also spoke with [facility] - they were unable to process added on BNP

## 2020-02-04 ENCOUNTER — Other Ambulatory Visit: Payer: 59

## 2020-02-05 ENCOUNTER — Telehealth: Payer: Self-pay

## 2020-02-05 NOTE — Telephone Encounter (Signed)
Patient calling back to speak to [facility] regarding her lab results.

## 2020-02-08 NOTE — Telephone Encounter (Signed)
Spoke with patient. Aware that referral was sent to hematology and that it can take 2 weeks for appt.

## 2020-02-11 ENCOUNTER — Ambulatory Visit
Admission: RE | Admit: 2020-02-11 | Discharge: 2020-02-11 | Disposition: A | Discharge status: Home / Self Care | Payer: 59 | Source: Ambulatory Visit | Attending: Family Medicine | Admitting: Family Medicine

## 2020-02-11 DIAGNOSIS — N920 Excessive and frequent menstruation with regular cycle: Secondary | ICD-10-CM

## 2020-02-11 NOTE — Progress Notes (Signed)
Please call patient: I have reviewed his/her u;trasound results. Uterus shows a polyp that needs further evaluation. This could be causing some of her heavy bleeding.I recommend seeing a gyn for further evaluation.  Please refer.

## 2020-02-17 ENCOUNTER — Other Ambulatory Visit: Payer: Self-pay

## 2020-02-17 ENCOUNTER — Telehealth: Payer: Self-pay

## 2020-02-17 DIAGNOSIS — D269 Other benign neoplasm of uterus, unspecified: Secondary | ICD-10-CM

## 2020-02-17 NOTE — Telephone Encounter (Signed)
Patient called regarding lab results 

## 2020-02-17 NOTE — Telephone Encounter (Signed)
Spoke with patient.

## 2020-02-22 ENCOUNTER — Ambulatory Visit (INDEPENDENT_AMBULATORY_CARE_PROVIDER_SITE_OTHER): Payer: 59 | Admitting: Family Medicine

## 2020-02-22 ENCOUNTER — Other Ambulatory Visit: Payer: Self-pay

## 2020-02-22 ENCOUNTER — Ambulatory Visit (HOSPITAL_COMMUNITY): Payer: 59 | Attending: Cardiology

## 2020-02-22 ENCOUNTER — Encounter: Payer: Self-pay | Admitting: Family Medicine

## 2020-02-22 VITALS — BP 126/82 | HR 91 | Temp 97.9°F | Ht 63.5 in | Wt 248.6 lb

## 2020-02-22 DIAGNOSIS — D500 Iron deficiency anemia secondary to blood loss (chronic): Secondary | ICD-10-CM

## 2020-02-22 DIAGNOSIS — I10 Essential (primary) hypertension: Secondary | ICD-10-CM | POA: Diagnosis not present

## 2020-02-22 DIAGNOSIS — R600 Localized edema: Secondary | ICD-10-CM | POA: Diagnosis not present

## 2020-02-22 DIAGNOSIS — N920 Excessive and frequent menstruation with regular cycle: Secondary | ICD-10-CM | POA: Diagnosis not present

## 2020-02-22 DIAGNOSIS — R9431 Abnormal electrocardiogram [ECG] [EKG]: Secondary | ICD-10-CM

## 2020-02-22 DIAGNOSIS — N840 Polyp of corpus uteri: Secondary | ICD-10-CM

## 2020-02-22 HISTORY — DX: Polyp of corpus uteri: N84.0

## 2020-02-22 HISTORY — DX: Excessive and frequent menstruation with regular cycle: N92.0

## 2020-02-22 LAB — ECHOCARDIOGRAM COMPLETE
Area-P 1/2: 3.99 cm2
S' Lateral: 2.8 cm

## 2020-02-22 MED ORDER — HYDROCHLOROTHIAZIDE 25 MG PO TABS: 25.0000 mg | ORAL_TABLET | Freq: Every day | ORAL | 3 refills | Status: DC

## 2020-02-22 NOTE — Addendum Note (Signed)
Addended by: Thomes Cake on: 02/22/2020 10:10 AM   Modules accepted: Orders

## 2020-02-22 NOTE — Patient Instructions (Signed)
Please return in 3 months for recheck.   Please take hctz one tab daily for your leg swelling.  Consider checking your weight daily.  Call me if you haven't gotten a call from [facility] by early next week.   I will release your lab results to you on your MyChart account with further instructions. Please reply with any questions.   If you have any questions or concerns, please don't hesitate to send me a message via MyChart or call the office at [phone]. Thank you for visiting with [place] today! It's our pleasure caring for you.   Edema  Edema is an abnormal buildup of fluids in the body tissues and under the skin. Swelling of the legs, feet, and ankles is a common symptom that becomes more likely as you get older. Swelling is also common in looser tissues, like around the eyes. When the affected area is squeezed, the fluid may move out of that spot and leave a dent for a few moments. This dent is called pitting edema. There are many possible causes of edema. Eating too much salt (sodium) and being on your feet or sitting for a long time can cause edema in your legs, feet, and ankles. Hot weather may make edema worse. Common causes of edema include:  Heart failure.  Liver or kidney disease.  Weak leg blood vessels.  Cancer.  An injury.  Pregnancy.  Medicines.  Being obese.  Low protein levels in the blood. Edema is usually painless. Your skin may look swollen or shiny. Follow these instructions at home:  Keep the affected body part raised (elevated) above the level of your heart when you are sitting or lying down.  Do not sit still or stand for long periods of time.  Do not wear tight clothing. Do not wear garters on your upper legs.  Exercise your legs to get your circulation going. This helps to move the fluid back into your blood vessels, and it may help the swelling go down.  Wear elastic bandages or support stockings to reduce swelling as told by your  health care provider.  Eat a low-salt (low-sodium) diet to reduce fluid as told by your health care provider.  Depending on the cause of your swelling, you may need to limit how much fluid you drink (fluid restriction).  Take over-the-counter and prescription medicines only as told by your health care provider. Contact a health care provider if:  Your edema does not get better with treatment.  You have heart, liver, or kidney disease and have symptoms of edema.  You have sudden and unexplained weight gain. Get help right away if:  You develop shortness of breath or chest pain.  You cannot breathe when you lie down.  You develop pain, redness, or warmth in the swollen areas.  You have heart, liver, or kidney disease and suddenly get edema.  You have a fever and your symptoms suddenly get worse. Summary  Edema is an abnormal buildup of fluids in the body tissues and under the skin.  Eating too much salt (sodium) and being on your feet or sitting for a long time can cause edema in your legs, feet, and ankles.  Keep the affected body part raised (elevated) above the level of your heart when you are sitting or lying down. This information is not intended to replace advice given to you by your health care provider. Make sure you discuss any questions you have with your health care provider. Document Revised:  08/06/2018 Document Reviewed: 04/21/2016 Elsevier Patient Education  [place].

## 2020-02-22 NOTE — Progress Notes (Signed)
Subjective  CC:  Chief Complaint  Patient presents with  . Hypertension    HPI: Michele Roman is a 49 y.o. female who presents to the office today to address the problems listed above in the chief complaint.  Lower extremity edema: Patient took Lasix for about 6 days but said it made her legs feel tighter.  She stopped it.  She also did not take the blood pressure medicine and is working on dietary changes.  She had her echocardiogram today, results are pending.  She denies chest pain.  Legs remain swollen.  She is using compression stockings.  Hypertension f/u: She has not checked her blood pressure at home.  She is hoping to resolve it without medications  Iron deficiency anemia due to menorrhagia: We reviewed her ultrasound report showing endometrial polyp.  She has been referred to gynecology but does not yet have an appointment.  She has been taking oral iron supplements and would like her iron and blood counts rechecked today.  She has been set up for an iron infusion.  She has not yet scheduled appointment.  If iron stores remain low, she will go ahead and set up the appointment.  Obesity: her weight is up. ? Related to volume.   Assessment  1. Essential hypertension   2. Iron deficiency anemia due to chronic blood loss   3. Menorrhagia with regular cycle   4. Bilateral lower extremity edema   5. Endometrial polyp   6. Morbid obesity ([place])      Plan    Hypertension f/u: BP control is well controlled. Not on meds at the moment. Will monitor  edema f/u: awaiting echocardiogram results. ? Right CHF +/- dependent edema or other. Renal function is normal. Trial of hctz in stead of lasix. Compression stockings  menorrahgia and endometrial polyp: referred to gyn  Iron def anemia: recheck on oral supplements x 4 weeks; iron infusion needed likley.   rec weight loss; continue with healthy dietary changes.    Education regarding management of these chronic disease states was  given. Management strategies discussed on successive visits include dietary and exercise recommendations, goals of achieving and maintaining IBW, and lifestyle modifications aiming for adequate sleep and minimizing stressors.   Follow up: 3 months for recheck.   Orders Placed This Encounter  Procedures  . Iron, TIBC and Ferritin Panel  . CBC with Differential/Platelet  . B Nat Peptide   Meds ordered this encounter  Medications  . hydrochlorothiazide (HYDRODIURIL) 25 MG tablet    Sig: Take 1 tablet (25 mg total) by mouth daily.    Dispense:  90 tablet    Refill:  3      BP Readings from Last 3 Encounters:  02/22/20 126/82  01/28/20 (!) 138/100  06/10/18 120/78   Wt Readings from Last 3 Encounters:  02/22/20 248 lb 9.6 oz (112.8 kg)  01/28/20 241 lb 12.8 oz (109.7 kg)  06/10/18 213 lb (96.6 kg)    Lab Results  Component Value Date   CHOL 203 (H) 01/28/2020   CHOL 213 (H) 06/10/2018   CHOL 211 (H) 06/14/2017   Lab Results  Component Value Date   HDL 34 (L) 01/28/2020   HDL 39.80 06/10/2018   HDL 38 (L) 06/14/2017   Lab Results  Component Value Date   LDLCALC 138 (H) 01/28/2020   LDLCALC 148 (H) 06/10/2018   LDLCALC 153 (H) 06/14/2017   Lab Results  Component Value Date   TRIG 179 (H) 01/28/2020  TRIG 126.0 06/10/2018   TRIG 92 06/14/2017   Lab Results  Component Value Date   CHOLHDL 6.0 (H) 01/28/2020   CHOLHDL 5 06/10/2018   CHOLHDL 5.6 (H) 06/14/2017   No results found for: LDLDIRECT Lab Results  Component Value Date   CREATININE 0.78 01/28/2020   BUN 7 01/28/2020   NA 139 01/28/2020   K 3.7 01/28/2020   CL 106 01/28/2020   CO2 25 01/28/2020    The 10-year ASCVD risk score Mikey Bussing DC Jr., et al., 2013) is: 5.9%   Values used to calculate the score:     Age: 71 years     Sex: Female     Is Non-Hispanic African American: Yes     Diabetic: No     Tobacco smoker: No     Systolic Blood Pressure: 518 mmHg     Is BP treated: Yes     HDL  Cholesterol: 34 mg/dL     Total Cholesterol: 203 mg/dL  I reviewed the patients updated PMH, FH, and SocHx.    Patient Active Problem List   Diagnosis Date Noted  . Menorrhagia with regular cycle 02/22/2020  . Endometrial polyp 02/22/2020  . Bilateral lower extremity edema 02/22/2020  . Morbid obesity ([place]) 02/22/2020  . Family history of colon cancer 06/10/2018  . Anemia, iron deficiency 06/25/2013  . Obesity (BMI 30-39.9) 01/14/2013  . Fatty liver 04/15/2009    Allergies: Hydrocodone  Social History: Patient  reports that she has never smoked. She has never used smokeless tobacco. She reports current alcohol use. She reports that she does not use drugs.  Current Meds  Medication Sig  . b complex vitamins tablet Take 1 tablet by mouth daily.  . Cholecalciferol (D3 ADULT PO) Take 1 tablet by mouth daily.  . Multiple Vitamins-Minerals (MULTIVITAMIN ADULT PO) Take 1 tablet by mouth daily.  . [DISCONTINUED] potassium chloride SA (KLOR-CON) 20 MEQ tablet Take 1 tablet (20 mEq total) by mouth daily as needed (leg swelling). Take with furosemide    Review of Systems: Cardiovascular: negative for chest pain, palpitations, leg swelling, orthopnea Respiratory: negative for SOB, wheezing or persistent cough Gastrointestinal: negative for abdominal pain Genitourinary: negative for dysuria or gross hematuria  Objective  Vitals: BP 126/82   Pulse 91   Temp 97.9 F (36.6 C) (Temporal)   Ht 5' 3.5" (1.613 m)   Wt 248 lb 9.6 oz (112.8 kg)   SpO2 94%   BMI 43.35 kg/m  General: no acute distress  Psych:  Alert and oriented, normal mood and affect HEENT:  Normocephalic, atraumatic, supple neck  Cardiovascular:  RRR without murmur. no edema Respiratory:  Good breath sounds bilaterally, CTAB with normal respiratory effort LE: bilateral +2 pitting edema to knees Gastrointestinal: soft, flat abdomen, normal active bowel sounds, no palpable masses, no hepatosplenomegaly, no appreciated  hernias   Commons side effects, risks, benefits, and alternatives for medications and treatment plan prescribed today were discussed, and the patient expressed understanding of the given instructions. Patient is instructed to call or message via MyChart if he/she has any questions or concerns regarding our treatment plan. No barriers to understanding were identified. We discussed Red Flag symptoms and signs in detail. Patient expressed understanding regarding what to do in case of urgent or emergency type symptoms.   Medication list was reconciled, printed and provided to the patient in AVS. Patient instructions and summary information was reviewed with the patient as documented in the AVS. This note was prepared with assistance of  [employer] training and education officer. Occasional wrong-word or sound-a-like substitutions may have occurred due to the inherent limitations of voice recognition software  This visit occurred during the SARS-CoV-2 public health emergency.  Safety protocols were in place, including screening questions prior to the visit, additional usage of staff PPE, and extensive cleaning of exam room while observing appropriate contact time as indicated for disinfecting solutions.

## 2020-02-23 LAB — CBC WITH DIFFERENTIAL/PLATELET
Absolute Monocytes: 433 {cells}/uL (ref 200–950)
Basophils Absolute: 23 {cells}/uL (ref 0–200)
Basophils Relative: 0.4 %
Eosinophils Absolute: 51 {cells}/uL (ref 15–500)
Eosinophils Relative: 0.9 %
HCT: 31.6 % — ABNORMAL LOW (ref 35.0–45.0)
Hemoglobin: 9 g/dL — ABNORMAL LOW (ref 11.7–15.5)
Lymphs Abs: 1995 {cells}/uL (ref 850–3900)
MCH: 18.6 pg — ABNORMAL LOW (ref 27.0–33.0)
MCHC: 28.5 g/dL — ABNORMAL LOW (ref 32.0–36.0)
MCV: 65.2 fL — ABNORMAL LOW (ref 80.0–100.0)
MPV: 9.8 fL (ref 7.5–12.5)
Monocytes Relative: 7.6 %
Neutro Abs: 3198 {cells}/uL (ref 1500–7800)
Neutrophils Relative %: 56.1 %
Platelets: 366 Thousand/uL (ref 140–400)
RBC: 4.85 Million/uL (ref 3.80–5.10)
RDW: 21.4 % — ABNORMAL HIGH (ref 11.0–15.0)
Total Lymphocyte: 35 %
WBC: 5.7 Thousand/uL (ref 3.8–10.8)

## 2020-02-23 LAB — BRAIN NATRIURETIC PEPTIDE

## 2020-02-23 LAB — CBC MORPHOLOGY

## 2020-02-23 LAB — IRON,TIBC AND FERRITIN PANEL
%SAT: 4 % — ABNORMAL LOW (ref 16–45)
Ferritin: 3 ng/mL — ABNORMAL LOW (ref 16–232)
Iron: 15 ug/dL — ABNORMAL LOW (ref 40–190)
TIBC: 334 ug/dL (ref 250–450)

## 2020-02-23 LAB — TIQ-MISC

## 2020-02-23 NOTE — Progress Notes (Signed)
Please call patient: I have reviewed his/her lab results. Anemia and iron deficiency continue to worsen. Needs to have iron infusion and rec GYN f/u; please ensure she gets scheduled. Thanks.

## 2020-02-29 ENCOUNTER — Telehealth: Payer: Self-pay | Admitting: Hematology and Oncology

## 2020-02-29 ENCOUNTER — Telehealth: Payer: Self-pay

## 2020-02-29 NOTE — Telephone Encounter (Signed)
Pt cld to r/s her new hem appt to see Dr. Lorenso Courier on 12/3 at [place]. Pt aware to arrive 30 minutes early.

## 2020-02-29 NOTE — Telephone Encounter (Signed)
Yes. I offered her the phone number to call and she stated she has already called and they told her that they have to call her. She states that Dr Jonni Sanger said to call our office back if she had not heard in a week.

## 2020-02-29 NOTE — Telephone Encounter (Signed)
Pt has not heard from [facility].

## 2020-02-29 NOTE — Telephone Encounter (Signed)
Please scan and email this referral to [email or web address]

## 2020-02-29 NOTE — Telephone Encounter (Signed)
Did you provide patient with number to contact office?

## 2020-03-04 ENCOUNTER — Inpatient Hospital Stay: Payer: 59

## 2020-03-04 ENCOUNTER — Other Ambulatory Visit: Payer: Self-pay

## 2020-03-04 ENCOUNTER — Encounter: Payer: Self-pay | Admitting: Hematology and Oncology

## 2020-03-04 ENCOUNTER — Inpatient Hospital Stay: Payer: 59 | Attending: Hematology and Oncology | Admitting: Hematology and Oncology

## 2020-03-04 VITALS — BP 135/98 | HR 107 | Temp 98.4°F | Resp 18 | Ht 63.5 in | Wt 246.5 lb

## 2020-03-04 DIAGNOSIS — N920 Excessive and frequent menstruation with regular cycle: Secondary | ICD-10-CM | POA: Diagnosis not present

## 2020-03-04 DIAGNOSIS — Z79899 Other long term (current) drug therapy: Secondary | ICD-10-CM | POA: Insufficient documentation

## 2020-03-04 DIAGNOSIS — D509 Iron deficiency anemia, unspecified: Secondary | ICD-10-CM | POA: Insufficient documentation

## 2020-03-04 DIAGNOSIS — D500 Iron deficiency anemia secondary to blood loss (chronic): Secondary | ICD-10-CM

## 2020-03-04 DIAGNOSIS — M7989 Other specified soft tissue disorders: Secondary | ICD-10-CM | POA: Insufficient documentation

## 2020-03-04 DIAGNOSIS — E669 Obesity, unspecified: Secondary | ICD-10-CM

## 2020-03-04 NOTE — Progress Notes (Signed)
[place] [phone]   Fax:([phone] [place] NOTE  Patient Care Team: Leamon Arnt, MD as PCP - General (Family Medicine)  Hematological/Oncological History # Iron Deficiency Anemia 2/2 to GYN Blood Loss 06/25/2013: WBC 6.7, Hgb 13.1, MCV 71, Plt 266 06/14/2017: WBC 6.2, Hgb 11.2, MCV 69.3, Plt 336 01/28/2020: WBC 7.1, Hgb 9.7, MCV 64.6, Plt 353. Iron 23, TIBC 389, Tsat 6%, Ferritin 3 02/22/2020: WBC 5.7, Hgb 9.0, MCV 65.2, Plt 366. Iron 15, TIBC 334, Tsat 4%, Ferritin 3 03/04/2020: establish care with Dr. Lorenso Courier   CHIEF COMPLAINTS/PURPOSE OF CONSULTATION:  "Iron Deficiency Anemia 2/2 to GYN Blood Loss "  HISTORY OF PRESENTING ILLNESS:  Michele Roman 49 y.o. female with medical history significant for HTN who presents for evaluation of iron deficiency anemia.   On review of the previous records Michele Roman has a longstanding history of anemia dating back to at least 06/14/2017 at which time showed a hemoglobin of 11.2, and an MCV of 69.3.  More recently on 01/28/2020 the patient is found have white blood cell count 7.1, hemoglobin 9.7, MCV 64.6, platelets of 353, iron of 23, total iron-binding capacity of 389, T sat of 6%, and a ferritin of 3.  She also had labs drawn on 02/22/2020 which showed hemoglobin 9.0, MCV of 65.2, and a ferritin of 3.  Due to concern for patient's low hemoglobin unclear iron deficiency anemia she was referred to hematology for further evaluation and management.  On exam today Michele Roman notes that her symptoms initially began with swelling of her legs and her abdomen.  She was seen by her primary care provider and OB/GYN and found to have low hemoglobin and low iron levels.  She reports that she has felt "winded while walking" and occasionally had some heart palpitations though these have subsequently resolved.  She was evaluated by OB/GYN and noted to have a polyp which is currently being scheduled for surgery later this  month.  She reports that she is also been having an increase in urination but denies having any bleeding, bruising, or dark stools.  On further discussion she notes that when she was told she is iron deficient began taking liquid iron 10 mL twice daily.  She also notes that she is taking a over-the-counter medication called blood builder's as well as chlorophyll.  She had been trialed on p.o. iron before in the past but it caused severe constipation.  The source of her blood loss is thought to be heavy menstrual cycles.  She reports that she typically has 7-day cycles that occur every 28 days but lately she has been having some abnormal periods including 2 in 1 month.  She notes the heaviest days are days 1-5 and she typically goes through about 3-4 pads per day.  Recently she is also noticed large clots.  She notes that her family history is remarkable for renal failure in her mother, colon cancer in her father, and colon cancer in her brother.  She was referred for colonoscopy prior to the start of the pandemic but has not been able to reschedule since that time.  She is a never smoker never drinker and currently works for the [employer].  She denies having any fevers, chills, sweats, nausea, vomiting or diarrhea.  She does not endorse any lightheadedness but does note she has ice cravings.  A full 10 point ROS is listed below.  MEDICAL HISTORY:  Past Medical History:  Diagnosis Date  .  Family history of colon cancer 06/10/2018   Father age 49  . H/O colonoscopy 03/01/09    SURGICAL HISTORY: Past Surgical History:  Procedure Laterality Date  . APPENDECTOMY  1997  . CESAREAN SECTION  1999  . EXPLORATORY LAPAROTOMY  1999  . Right foot surgery  2008, 2011   hardware in the foot    SOCIAL HISTORY: Social History   Socioeconomic History  . Marital status: Married    Spouse name: Matt  . Number of children: 2  . Years of education: Not on file  . Highest education level: Not on file    Occupational History  . Occupation: Engineer, agricultural    Comment: [employer]  Tobacco Use  . Smoking status: Never Smoker  . Smokeless tobacco: Never Used  Substance and Sexual Activity  . Alcohol use: Yes    Comment: Rarely   . Drug use: Never  . Sexual activity: Yes    Partners: Male    Birth control/protection: Surgical    Comment: tubal ligation  Other Topics Concern  . 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    Social Determinants of Health   Financial Resource Strain:   . Difficulty of Paying Living Expenses: Not on file  Food Insecurity:   . Worried About Charity fundraiser in the Last Year: Not on file  . Ran Out of Food in the Last Year: Not on file  Transportation Needs:   . Lack of Transportation (Medical): Not on file  . Lack of Transportation (Non-Medical): Not on file  Physical Activity:   . Days of Exercise per Week: Not on file  . Minutes of Exercise per Session: Not on file  Stress:   . Feeling of Stress : Not on file  Social Connections:   . Frequency of Communication with Friends and Family: Not on file  . Frequency of Social Gatherings with Friends and Family: Not on file  . Attends Religious Services: Not on file  . Active Member of Clubs or Organizations: Not on file  . Attends Archivist Meetings: Not on file  . Marital Status: Not on file  Intimate Partner Violence:   . Fear of Current or Ex-Partner: Not on file  . Emotionally Abused: Not on file  . Physically Abused: Not on file  . Sexually Abused: Not on file    FAMILY HISTORY: Family History  Problem Relation Age of Onset  . Hypertension Mother   . Heart disease Mother 61  . Diabetes Mother   . Hyperlipidemia Mother   . Hypertension Brother   . Colon cancer Father   . Heart defect Father   . Healthy Daughter   . Healthy Daughter   . Autoimmune disease Sister   . Breast cancer Paternal Aunt     ALLERGIES:  is allergic to  hydrocodone.  MEDICATIONS:  Current Outpatient Medications  Medication Sig Dispense Refill  . b complex vitamins tablet Take 1 tablet by mouth daily.    . Cholecalciferol (D3 ADULT PO) Take 1 tablet by mouth daily.    . Multiple Vitamins-Minerals (MULTIVITAMIN ADULT PO) Take 1 tablet by mouth daily.     No current facility-administered medications for this visit.    REVIEW OF SYSTEMS:   Constitutional: ( - ) fevers, ( - )  chills , ( - ) night sweats Eyes: ( - ) blurriness of vision, ( - ) double vision, ( - ) watery  eyes Ears, nose, mouth, throat, and face: ( - ) mucositis, ( - ) sore throat Respiratory: ( - ) cough, ( - ) dyspnea, ( - ) wheezes Cardiovascular: ( - ) palpitation, ( - ) chest discomfort, ( - ) lower extremity swelling Gastrointestinal:  ( - ) nausea, ( - ) heartburn, ( - ) change in bowel habits Skin: ( - ) abnormal skin rashes Lymphatics: ( - ) new lymphadenopathy, ( - ) easy bruising Neurological: ( - ) numbness, ( - ) tingling, ( - ) new weaknesses Behavioral/Psych: ( - ) mood change, ( - ) new changes  All other systems were reviewed with the patient and are negative.  PHYSICAL EXAMINATION:  Vitals:   03/04/20 0854  BP: (!) 135/98  Pulse: (!) 107  Resp: 18  Temp: 98.4 F (36.9 C)  SpO2: 100%   Filed Weights   03/04/20 0854  Weight: 246 lb 8 oz (111.8 kg)    GENERAL: well appearing middle aged [place] American female in NAD  SKIN: skin color, texture, turgor are normal, no rashes or significant lesions EYES: conjunctiva are pink and non-injected, sclera clear LUNGS: clear to auscultation and percussion with normal breathing effort HEART: regular rate & rhythm and no murmurs and no lower extremity edema ABDOMEN: exam limited 2/2 to body habitus.  Musculoskeletal: no cyanosis of digits and no clubbing  PSYCH: alert & oriented x 3, fluent speech NEURO: no focal motor/sensory deficits  LABORATORY DATA:  I have reviewed the data as listed CBC Latest  Ref Rng & Units 02/22/2020 01/28/2020 06/10/2018  WBC 3.8 - 10.8 Thousand/uL 5.7 7.1 7.5  Hemoglobin 11.7 - 15.5 g/dL 9.0(L) 9.7(L) 10.4(L)  Hematocrit 35 - 45 % 31.6(L) 34.1(L) 32.8(L)  Platelets 140 - 400 Thousand/uL 366 353 318.0    CMP Latest Ref Rng & Units 01/28/2020 06/10/2018 06/14/2017  Glucose 65 - 99 mg/dL 100(H) 119(H) 95  BUN 7 - 25 mg/dL 7 9 13   Creatinine 0.50 - 1.10 mg/dL 0.78 0.84 0.84  Sodium 135 - 146 mmol/L 139 139 139  Potassium 3.5 - 5.3 mmol/L 3.7 3.5 4.2  Chloride 98 - 110 mmol/L 106 103 106  CO2 20 - 32 mmol/L 25 26 26   Calcium 8.6 - 10.2 mg/dL 8.8 9.2 9.1  Total Protein 6.1 - 8.1 g/dL 6.5 7.4 7.1  Total Bilirubin 0.2 - 1.2 mg/dL 0.5 0.4 0.3  Alkaline Phos 39 - 117 U/L - 73 -  AST 10 - 35 U/L 15 15 16   ALT 6 - 29 U/L 9 8 10     RADIOGRAPHIC STUDIES: ECHOCARDIOGRAM COMPLETE  Result Date: 02/22/2020    ECHOCARDIOGRAM REPORT   Patient Name:   CEAZIA HARB Buresh Date of Exam: 02/22/2020 Medical Rec #:  [id number]     Height:       63.5 in Accession #:    [id number]    Weight:       241.8 lb Date of Birth:  October 24, 1970     BSA:          2.108 m Patient Age:    89 years      BP:           152/74 mmHg Patient Gender: F             HR:           82 bpm. Exam Location:  [address] Procedure: 2D Echo, Cardiac Doppler and Color Doppler Indications:    R94.31 Abnormal EKG  R60.0 Bilateral Lower Extremity Edema  History:        Patient has no prior history of Echocardiogram examinations.                 Risk Factors:Hypertension and Obesity.  Sonographer:    Wilford Sports Rodgers-Jones RDCS Referring Phys: [id number] CAMILLE L ANDY IMPRESSIONS  1. Left ventricular ejection fraction, by estimation, is 60 to 65%. The left ventricle has normal function. The left ventricle has no regional wall motion abnormalities. Left ventricular diastolic parameters were normal.  2. Right ventricular systolic function is normal. The right ventricular size is normal.  3. The mitral valve is normal in  structure. Trivial mitral valve regurgitation. No evidence of mitral stenosis.  4. The aortic valve is normal in structure. Aortic valve regurgitation is not visualized. No aortic stenosis is present.  5. The inferior vena cava is normal in size with greater than 50% respiratory variability, suggesting right atrial pressure of 3 mmHg. FINDINGS  Left Ventricle: Left ventricular ejection fraction, by estimation, is 60 to 65%. The left ventricle has normal function. The left ventricle has no regional wall motion abnormalities. The left ventricular internal cavity size was normal in size. There is  no left ventricular hypertrophy. Left ventricular diastolic parameters were normal. Right Ventricle: The right ventricular size is normal. No increase in right ventricular wall thickness. Right ventricular systolic function is normal. Left Atrium: Left atrial size was normal in size. Right Atrium: Right atrial size was normal in size. Pericardium: There is no evidence of pericardial effusion. Mitral Valve: The mitral valve is normal in structure. Trivial mitral valve regurgitation. No evidence of mitral valve stenosis. Tricuspid Valve: The tricuspid valve is normal in structure. Tricuspid valve regurgitation is trivial. No evidence of tricuspid stenosis. Aortic Valve: The aortic valve is normal in structure. Aortic valve regurgitation is not visualized. No aortic stenosis is present. Pulmonic Valve: The pulmonic valve was normal in structure. Pulmonic valve regurgitation is not visualized. No evidence of pulmonic stenosis. Aorta: The aortic root is normal in size and structure. Venous: The inferior vena cava is normal in size with greater than 50% respiratory variability, suggesting right atrial pressure of 3 mmHg. IAS/Shunts: No atrial level shunt detected by color flow Doppler.  LEFT VENTRICLE PLAX 2D LVIDd:         4.10 cm  Diastology LVIDs:         2.80 cm  LV e' medial:    10.20 cm/s LV PW:         0.90 cm  LV E/e'  medial:  8.8 LV IVS:        0.70 cm  LV e' lateral:   13.70 cm/s LVOT diam:     2.00 cm  LV E/e' lateral: 6.5 LV SV:         63 LV SV Index:   30 LVOT Area:     3.14 cm  RIGHT VENTRICLE RV Basal diam:  3.10 cm RV S prime:     14.50 cm/s TAPSE (M-mode): 1.7 cm LEFT ATRIUM             Index       RIGHT ATRIUM           Index LA diam:        4.00 cm 1.90 cm/m  RA Area:     10.60 cm LA Vol (A2C):   51.4 ml 24.38 ml/m RA Volume:   24.60 ml  11.67 ml/m LA Vol (A4C):  33.2 ml 15.75 ml/m LA Biplane Vol: 44.4 ml 21.06 ml/m  AORTIC VALVE LVOT Vmax:   97.40 cm/s LVOT Vmean:  67.900 cm/s LVOT VTI:    0.199 m  AORTA Ao Root diam: 3.10 cm Ao Asc diam:  3.60 cm MITRAL VALVE               TRICUSPID VALVE MV Area (PHT): 3.99 cm    TR Peak grad:   20.6 mmHg MV Decel Time: 190 msec    TR Vmax:        227.00 cm/s MV E velocity: 89.70 cm/s MV A velocity: 59.70 cm/s  SHUNTS MV E/A ratio:  1.50        Systemic VTI:  0.20 m                            Systemic Diam: 2.00 cm Candee Furbish MD Electronically signed by Candee Furbish MD Signature Date/Time: 02/22/2020/10:44:25 AM    Final    US PELVIC COMPLETE WITH TRANSVAGINAL  Result Date: 02/11/2020 CLINICAL DATA:  Menorrhagia with irregular cycle, irregular bleeding for 1 month, chronic heavy menses, had 2 menstrual periods last month EXAM: TRANSABDOMINAL AND TRANSVAGINAL ULTRASOUND OF PELVIS TECHNIQUE: Both transabdominal and transvaginal ultrasound examinations of the pelvis were performed. Transabdominal technique was performed for global imaging of the pelvis including uterus, ovaries, adnexal regions, and pelvic cul-de-sac. It was necessary to proceed with endovaginal exam following the transabdominal exam to visualize the lower uterine segment and ovaries. COMPARISON:  None FINDINGS: Uterus Measurements: 12.4 x 5.2 x 6.3 cm = volume: 214 mL. Anteverted. Anterior wall deformity from Caesarean section. Otherwise normal morphology without mass Endometrium Thickness: 15 mm. Focal  echogenic mass at upper uterine segment, 15 x 8 x 17 mm highly suspicious for polyp. This is much better demonstrated on transabdominal imaging, with transvaginal imaging limited secondary to bowel and patient inability to empty bladder. No obvious endometrial fluid. Right ovary Measurements: 4.1 x 2.2 x 2.8 cm = volume: 13.5 mL. Normal morphology without mass Left ovary Measurements: 2.9 x 1.8 x 2.2 cm = volume: 5.9 mL. Normal morphology without mass Other findings No free pelvic fluid.  No adnexal masses. IMPRESSION: Suspected 15 x 8 x 17 mm endometrial polyp at upper uterine segment, much better demonstrated on transabdominal imaging; recommend sonohysterogram for further evaluation, prior to hysteroscopy or endometrial biopsy. Electronically Signed   By: Lavonia Dana M.D.   On: 02/11/2020 16:40    ASSESSMENT & PLAN Michele Roman 49 y.o. female with medical history significant for HTN who presents for evaluation of iron deficiency anemia.  After review the labs, the records, discussion with the patient the findings are most consistent with iron deficiency anemia secondary to GYN bleeding.  She is established with OB/GYN and is currently planning to have resection of a polyp which may be causing some of her blood loss.  She is currently taking over-the-counter liquid iron and other iron supplements because she had intolerance to p.o. iron pills before in the past.  She presents today for consideration of IV iron therapy.  Given her low hemoglobin and low iron levels I do believe would be appropriate to proceed with IV Feraheme 510 mg q. 7 days x 2 doses in order to bolster her levels prior to an OB/GYN surgery.  The patient was in agreement with this plan moving forward.  Additionally I am somewhat concerned about her lower extremity swelling and increased  urination.  We will order UA to look for proteinuria.  We will plan to see her back approximately 4 to 6 weeks after last dose of IV iron.  # Iron  Deficiency Anemia 2/2 to GYN Blood Loss --findings consistent with iron deficiency anemia 2/2 to GYN bleeding --will order iron panel, ferritin, reticulocyte count, CBC, CMP --recommend proceeding with IV feraheme 510mg  q7 days x 2 doses --patient has established care with OB/GYN, planning procedure for polyp resection.  --encourage patient to reschedule colonoscopy cancelled in 2020. --RTC 4-6 weeks after last dose of IV iron.   #Lower Extremity Swelling #Increased Urination --will order UA to assess for proteinuria.   No orders of the defined types were placed in this encounter.   All questions were answered. The patient knows to call the clinic with any problems, questions or concerns.  A total of more than 60 minutes were spent on this encounter and over half of that time was spent on counseling and coordination of care as outlined above.   Ledell Peoples, MD Department of Hematology/Oncology [place] [facility] Phone: [phone] Pager: [phone] Email: [email or web address]  03/04/2020 9:18 AM

## 2020-03-10 ENCOUNTER — Telehealth: Payer: Self-pay | Admitting: *Deleted

## 2020-03-10 ENCOUNTER — Encounter: Payer: 59 | Admitting: Hematology and Oncology

## 2020-03-10 ENCOUNTER — Other Ambulatory Visit: Payer: Self-pay | Admitting: Hematology and Oncology

## 2020-03-10 NOTE — Telephone Encounter (Signed)
Received vm message from patient asking to confirm her iron infusion appt for tomorrow.  TCT patient and spoke with her. Confirmed her appt for 03/11/20 and reviewed future iron infusion appts. Advised that there are 2 appts that are still being worked on to schedule. Pt voiced understanding

## 2020-03-11 ENCOUNTER — Inpatient Hospital Stay: Payer: 59

## 2020-03-11 ENCOUNTER — Other Ambulatory Visit: Payer: Self-pay

## 2020-03-11 VITALS — BP 116/63 | HR 78 | Temp 98.5°F | Resp 18

## 2020-03-11 DIAGNOSIS — D500 Iron deficiency anemia secondary to blood loss (chronic): Secondary | ICD-10-CM

## 2020-03-11 DIAGNOSIS — D509 Iron deficiency anemia, unspecified: Secondary | ICD-10-CM | POA: Diagnosis not present

## 2020-03-11 MED ORDER — SODIUM CHLORIDE 0.9 % IV SOLN
200.0000 mg | Freq: Once | INTRAVENOUS | Status: AC
  Administered 2020-03-11: 200 mg via INTRAVENOUS
  Filled 2020-03-11: qty 200

## 2020-03-11 MED ORDER — SODIUM CHLORIDE 0.9 % IV SOLN
Freq: Once | INTRAVENOUS | Status: AC
  Filled 2020-03-11: qty 250

## 2020-03-11 NOTE — Patient Instructions (Signed)

## 2020-03-11 NOTE — Progress Notes (Signed)
Pt. stayed for 30 minute post observation. Tolerated treatment without issues. Stable for discharge. Left via ambulation, no respiratory distress noted.

## 2020-03-17 NOTE — Telephone Encounter (Signed)
Referral faxed to [facility] with the requested information-will call to check status

## 2020-03-18 ENCOUNTER — Other Ambulatory Visit: Payer: Self-pay

## 2020-03-18 ENCOUNTER — Inpatient Hospital Stay: Payer: 59

## 2020-03-18 VITALS — BP 127/77 | HR 73 | Temp 98.1°F | Resp 18

## 2020-03-18 DIAGNOSIS — D509 Iron deficiency anemia, unspecified: Secondary | ICD-10-CM | POA: Diagnosis not present

## 2020-03-18 DIAGNOSIS — D500 Iron deficiency anemia secondary to blood loss (chronic): Secondary | ICD-10-CM

## 2020-03-18 MED ORDER — SODIUM CHLORIDE 0.9 % IV SOLN
Freq: Once | INTRAVENOUS | Status: AC
  Filled 2020-03-18: qty 250

## 2020-03-18 MED ORDER — SODIUM CHLORIDE 0.9 % IV SOLN
200.0000 mg | Freq: Once | INTRAVENOUS | Status: AC
  Administered 2020-03-18: 200 mg via INTRAVENOUS
  Filled 2020-03-18: qty 200

## 2020-03-18 NOTE — Patient Instructions (Signed)

## 2020-03-24 ENCOUNTER — Inpatient Hospital Stay: Payer: 59

## 2020-03-24 ENCOUNTER — Ambulatory Visit: Payer: 59

## 2020-03-24 ENCOUNTER — Other Ambulatory Visit: Payer: Self-pay

## 2020-03-24 VITALS — BP 133/86 | HR 75 | Temp 98.1°F | Resp 18

## 2020-03-24 DIAGNOSIS — D509 Iron deficiency anemia, unspecified: Secondary | ICD-10-CM | POA: Diagnosis not present

## 2020-03-24 DIAGNOSIS — D500 Iron deficiency anemia secondary to blood loss (chronic): Secondary | ICD-10-CM

## 2020-03-24 MED ORDER — SODIUM CHLORIDE 0.9 % IV SOLN
200.0000 mg | Freq: Once | INTRAVENOUS | Status: AC
  Administered 2020-03-24: 200 mg via INTRAVENOUS
  Filled 2020-03-24: qty 200

## 2020-03-24 MED ORDER — SODIUM CHLORIDE 0.9 % IV SOLN
Freq: Once | INTRAVENOUS | Status: AC
Start: 2020-03-24 — End: 2020-03-24
  Filled 2020-03-24: qty 250

## 2020-03-24 NOTE — Patient Instructions (Signed)

## 2020-03-31 ENCOUNTER — Other Ambulatory Visit: Payer: Self-pay

## 2020-03-31 ENCOUNTER — Inpatient Hospital Stay: Payer: 59

## 2020-03-31 ENCOUNTER — Ambulatory Visit: Payer: 59

## 2020-03-31 VITALS — BP 125/87 | HR 66 | Temp 98.2°F | Resp 18 | Wt 247.8 lb

## 2020-03-31 DIAGNOSIS — D500 Iron deficiency anemia secondary to blood loss (chronic): Secondary | ICD-10-CM

## 2020-03-31 DIAGNOSIS — D509 Iron deficiency anemia, unspecified: Secondary | ICD-10-CM | POA: Diagnosis not present

## 2020-03-31 MED ORDER — SODIUM CHLORIDE 0.9 % IV SOLN
Freq: Once | INTRAVENOUS | Status: AC
  Filled 2020-03-31: qty 250

## 2020-03-31 MED ORDER — SODIUM CHLORIDE 0.9 % IV SOLN
200.0000 mg | Freq: Once | INTRAVENOUS | Status: AC
  Administered 2020-03-31: 200 mg via INTRAVENOUS
  Filled 2020-03-31: qty 200

## 2020-03-31 NOTE — Patient Instructions (Signed)

## 2020-04-02 HISTORY — PX: CERVICAL POLYPECTOMY: SHX88

## 2020-04-02 HISTORY — PX: DILATION AND CURETTAGE, DIAGNOSTIC / THERAPEUTIC: SUR384

## 2020-04-02 HISTORY — PX: OTHER SURGICAL HISTORY: SHX169

## 2020-04-08 ENCOUNTER — Ambulatory Visit: Payer: 59

## 2020-04-15 ENCOUNTER — Inpatient Hospital Stay: Payer: 59 | Attending: Hematology and Oncology

## 2020-04-15 ENCOUNTER — Other Ambulatory Visit: Payer: Self-pay

## 2020-04-15 VITALS — BP 117/76 | HR 70 | Temp 98.0°F | Resp 18

## 2020-04-15 DIAGNOSIS — D509 Iron deficiency anemia, unspecified: Secondary | ICD-10-CM | POA: Insufficient documentation

## 2020-04-15 DIAGNOSIS — Z79899 Other long term (current) drug therapy: Secondary | ICD-10-CM | POA: Diagnosis not present

## 2020-04-15 DIAGNOSIS — D500 Iron deficiency anemia secondary to blood loss (chronic): Secondary | ICD-10-CM

## 2020-04-15 MED ORDER — SODIUM CHLORIDE 0.9 % IV SOLN
Freq: Once | INTRAVENOUS | Status: AC
  Filled 2020-04-15: qty 250

## 2020-04-15 MED ORDER — SODIUM CHLORIDE 0.9 % IV SOLN
200.0000 mg | Freq: Once | INTRAVENOUS | Status: AC
  Administered 2020-04-15: 200 mg via INTRAVENOUS
  Filled 2020-04-15: qty 10

## 2020-04-15 NOTE — Progress Notes (Signed)
Patient refused 30 min post observation period for venofer, discharged in stable condition

## 2020-04-15 NOTE — Patient Instructions (Signed)

## 2020-04-22 ENCOUNTER — Inpatient Hospital Stay: Payer: 59 | Admitting: Hematology and Oncology

## 2020-04-22 ENCOUNTER — Inpatient Hospital Stay: Payer: 59

## 2020-04-24 NOTE — Progress Notes (Signed)
[place] [phone]   Fax:(336) [phone]  PROGRESS NOTE  Patient Care Team: Leamon Arnt, MD as PCP - General (Family Medicine)  Hematological/Oncological History # Iron Deficiency Anemia 2/2 to GYN Blood Loss 06/25/2013: WBC 6.7, Hgb 13.1, MCV 71, Plt 266 06/14/2017: WBC 6.2, Hgb 11.2, MCV 69.3, Plt 336 01/28/2020: WBC 7.1, Hgb 9.7, MCV 64.6, Plt 353. Iron 23, TIBC 389, Tsat 6%, Ferritin 3 02/22/2020: WBC 5.7, Hgb 9.0, MCV 65.2, Plt 366. Iron 15, TIBC 334, Tsat 4%, Ferritin 3 03/04/2020: establish care with Dr. Lorenso Courier  12/10-1/14/2022: received 5 doses of IV sucrose 200mg  04/25/2020: WBC 5.5, Hgb 12.6, MCV 71.7, Plt 230  Interval History:  Michele Roman 50 y.o. female with medical history significant for iron deficiency anemia 2/2 to GYN blood loss who presents for a follow up visit. The patient's last visit was on 03/04/2020 at which time she established care. In the interim since the last visit she has received 5 doses of IV iron sucrose.   On exam today Michele Roman notes that she tolerated the IV iron therapy well with no side effects or symptoms.  She notes that she has had improvement in her ice cravings as well as she now has more energy.  She reports the swelling is going down her leg and she overall feels well.  She has been taking blood builders" capsules as well as beet orange juice in order to help raise her iron levels.  She otherwise denies having any fevers, chills, sweats, nausea, running or diarrhea.  A full 10 point ROS is listed below  MEDICAL HISTORY:  Past Medical History:  Diagnosis Date  . Family history of colon cancer 06/10/2018   Father age 64  . H/O colonoscopy 03/01/09    SURGICAL HISTORY: Past Surgical History:  Procedure Laterality Date  . APPENDECTOMY  1997  . CESAREAN SECTION  1999  . EXPLORATORY LAPAROTOMY  1999  . Right foot surgery  2008, 2011   hardware in the foot    SOCIAL HISTORY: Social History    Socioeconomic History  . Marital status: Married    Spouse name: Matt  . Number of children: 2  . Years of education: Not on file  . Highest education level: Not on file  Occupational History  . Occupation: Engineer, agricultural    Comment: [employer]  Tobacco Use  . Smoking status: Never Smoker  . Smokeless tobacco: Never Used  Substance and Sexual Activity  . Alcohol use: Yes    Comment: Rarely   . Drug use: Never  . Sexual activity: Yes    Partners: Male    Birth control/protection: Surgical    Comment: tubal ligation  Other Topics Concern  . 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    Social Determinants of Health   Financial Resource Strain: Not on file  Food Insecurity: Not on file  Transportation Needs: Not on file  Physical Activity: Not on file  Stress: Not on file  Social Connections: Not on file  Intimate Partner Violence: Not on file    FAMILY HISTORY: Family History  Problem Relation Age of Onset  . Hypertension Mother   . Heart disease Mother 55  . Diabetes Mother   . Hyperlipidemia Mother   . Hypertension Brother   . Colon cancer Father   . Heart defect Father   . Healthy Daughter   . Healthy Daughter   . Autoimmune  disease Sister   . Breast cancer Paternal Aunt     ALLERGIES:  is allergic to hydrocodone.  MEDICATIONS:  Current Outpatient Medications  Medication Sig Dispense Refill  . b complex vitamins tablet Take 1 tablet by mouth daily.    . Cholecalciferol (D3 ADULT PO) Take 1 tablet by mouth daily.    . Multiple Vitamins-Minerals (MULTIVITAMIN ADULT PO) Take 1 tablet by mouth daily.     No current facility-administered medications for this visit.    REVIEW OF SYSTEMS:   Constitutional: ( - ) fevers, ( - )  chills , ( - ) night sweats Eyes: ( - ) blurriness of vision, ( - ) double vision, ( - ) watery eyes Ears, nose, mouth, throat, and face: ( - ) mucositis, ( - ) sore throat Respiratory: ( - )  cough, ( - ) dyspnea, ( - ) wheezes Cardiovascular: ( - ) palpitation, ( - ) chest discomfort, ( - ) lower extremity swelling Gastrointestinal:  ( - ) nausea, ( - ) heartburn, ( - ) change in bowel habits Skin: ( - ) abnormal skin rashes Lymphatics: ( - ) new lymphadenopathy, ( - ) easy bruising Neurological: ( - ) numbness, ( - ) tingling, ( - ) new weaknesses Behavioral/Psych: ( - ) mood change, ( - ) new changes  All other systems were reviewed with the patient and are negative.  PHYSICAL EXAMINATION:  Vitals:   04/25/20 0810  BP: (!) 126/91  Pulse: 81  Resp: 17  Temp: 98 F (36.7 C)  SpO2: 100%   Filed Weights   04/25/20 0810  Weight: 242 lb 12.8 oz (110.1 kg)    GENERAL: well appearing middle aged [place] American female alert, no distress and comfortable SKIN: skin color, texture, turgor are normal, no rashes or significant lesions EYES: conjunctiva are pink and non-injected, sclera clear LUNGS: clear to auscultation and percussion with normal breathing effort HEART: regular rate & rhythm and no murmurs and no lower extremity edema Musculoskeletal: no cyanosis of digits and no clubbing  PSYCH: alert & oriented x 3, fluent speech NEURO: no focal motor/sensory deficits  LABORATORY DATA:  I have reviewed the data as listed CBC Latest Ref Rng & Units 04/25/2020 02/22/2020 01/28/2020  WBC 4.0 - 10.5 K/uL 5.5 5.7 7.1  Hemoglobin 12.0 - 15.0 g/dL 12.6 9.0(L) 9.7(L)  Hematocrit 36.0 - 46.0 % 41.2 31.6(L) 34.1(L)  Platelets 150 - 400 K/uL 230 366 353    CMP Latest Ref Rng & Units 01/28/2020 06/10/2018 06/14/2017  Glucose 65 - 99 mg/dL 100(H) 119(H) 95  BUN 7 - 25 mg/dL 7 9 13   Creatinine 0.50 - 1.10 mg/dL 0.78 0.84 0.84  Sodium 135 - 146 mmol/L 139 139 139  Potassium 3.5 - 5.3 mmol/L 3.7 3.5 4.2  Chloride 98 - 110 mmol/L 106 103 106  CO2 20 - 32 mmol/L 25 26 26   Calcium 8.6 - 10.2 mg/dL 8.8 9.2 9.1  Total Protein 6.1 - 8.1 g/dL 6.5 7.4 7.1  Total Bilirubin 0.2 - 1.2  mg/dL 0.5 0.4 0.3  Alkaline Phos 39 - 117 U/L - 73 -  AST 10 - 35 U/L 15 15 16   ALT 6 - 29 U/L 9 8 10     RADIOGRAPHIC STUDIES: No results found.  ASSESSMENT & PLAN Michele Roman 50 y.o. female with medical history significant for iron deficiency anemia 2/2 to GYN blood loss who presents for a follow up visit.  After review the labs, review the records,  schedule the patient the findings are most consistent with iron deficiency anemia adequately treated with IV iron therapy.  Fortunately in the interim she has had a endometrial polyp resection performed on 04/07/2020 which should help stem the source of the patient's bleeding.  Overall she tolerated the IV iron therapy well and has had a robust response in her hemoglobin with an increase to 12.6 (from 9.0 prior to her first clinic visit).  We are currently awaiting the results of her iron studies and if they are found to be normal we will have her back in approximately 6 months time.  If she remains iron deficient we will see her back sooner.  # Iron Deficiency Anemia 2/2 to GYN Blood Loss --findings consistent with iron deficiency anemia 2/2 to GYN bleeding --will order repeat iron panel, ferritin, reticulocyte count, CBC, CMP --patient completed  IV iron sucrose 2000mg  q7 days x 5 doses --patient has established care with OB/GYN, procedure for endometrial polyp resection performed on 04/07/2020.  --encourage patient to reschedule colonoscopy cancelled in 2020. --RTC in 6 months. Can return sooner if iron levels are inadequate.   #Lower Extremity Swelling #Increased Urination --will order UA to assess for proteinuria.   No orders of the defined types were placed in this encounter.   All questions were answered. The patient knows to call the clinic with any problems, questions or concerns.  A total of more than 30 minutes were spent on this encounter and over half of that time was spent on counseling and coordination of care as outlined  above.   Ledell Peoples, MD Department of Hematology/Oncology [place] at [facility] Phone: [phone] Pager: [phone] Email: [email or web address]  04/25/2020 8:42 AM

## 2020-04-25 ENCOUNTER — Inpatient Hospital Stay: Payer: 59

## 2020-04-25 ENCOUNTER — Inpatient Hospital Stay: Payer: 59 | Admitting: Hematology and Oncology

## 2020-04-25 ENCOUNTER — Other Ambulatory Visit: Payer: Self-pay

## 2020-04-25 ENCOUNTER — Encounter: Payer: Self-pay | Admitting: Hematology and Oncology

## 2020-04-25 VITALS — BP 126/91 | HR 81 | Temp 98.0°F | Resp 17 | Ht 64.0 in | Wt 242.8 lb

## 2020-04-25 DIAGNOSIS — N920 Excessive and frequent menstruation with regular cycle: Secondary | ICD-10-CM | POA: Diagnosis not present

## 2020-04-25 DIAGNOSIS — D509 Iron deficiency anemia, unspecified: Secondary | ICD-10-CM | POA: Diagnosis not present

## 2020-04-25 DIAGNOSIS — E669 Obesity, unspecified: Secondary | ICD-10-CM

## 2020-04-25 DIAGNOSIS — D500 Iron deficiency anemia secondary to blood loss (chronic): Secondary | ICD-10-CM

## 2020-04-25 LAB — URINALYSIS, COMPLETE (UACMP) WITH MICROSCOPIC
Bilirubin Urine: NEGATIVE
Glucose, UA: NEGATIVE mg/dL
Ketones, ur: NEGATIVE mg/dL
Leukocytes,Ua: NEGATIVE
Nitrite: NEGATIVE
Protein, ur: NEGATIVE mg/dL
Specific Gravity, Urine: 1.005 (ref 1.005–1.030)
pH: 6 (ref 5.0–8.0)

## 2020-04-25 LAB — CMP (CANCER CENTER ONLY)
ALT: 9 U/L (ref 0–44)
AST: 15 U/L (ref 15–41)
Albumin: 3.8 g/dL (ref 3.5–5.0)
Alkaline Phosphatase: 41 U/L (ref 38–126)
Anion gap: 9 (ref 5–15)
BUN: 9 mg/dL (ref 6–20)
CO2: 23 mmol/L (ref 22–32)
Calcium: 8.8 mg/dL — ABNORMAL LOW (ref 8.9–10.3)
Chloride: 110 mmol/L (ref 98–111)
Creatinine: 0.82 mg/dL (ref 0.44–1.00)
GFR, Estimated: >60 mL/min (ref >60)
Glucose, Bld: 87 mg/dL (ref 70–99)
Potassium: 4.4 mmol/L (ref 3.5–5.1)
Sodium: 142 mmol/L (ref 135–145)
Total Bilirubin: 0.2 mg/dL — ABNORMAL LOW (ref 0.3–1.2)
Total Protein: 6.3 g/dL — ABNORMAL LOW (ref 6.5–8.1)

## 2020-04-25 LAB — IRON AND TIBC
Iron: 70 ug/dL (ref 41–142)
Saturation Ratios: 31 % (ref 21–57)
TIBC: 224 ug/dL — ABNORMAL LOW (ref 236–444)
UIBC: 154 ug/dL (ref 120–384)

## 2020-04-25 LAB — CBC WITH DIFFERENTIAL (CANCER CENTER ONLY)
Abs Immature Granulocytes: 0.01 10*3/uL (ref 0.00–0.07)
Basophils Absolute: 0 10*3/uL (ref 0.0–0.1)
Basophils Relative: 1 %
Eosinophils Absolute: 0 10*3/uL (ref 0.0–0.5)
Eosinophils Relative: 1 %
HCT: 41.2 % (ref 36.0–46.0)
Hemoglobin: 12.6 g/dL (ref 12.0–15.0)
Immature Granulocytes: 0 %
Lymphocytes Relative: 41 %
Lymphs Abs: 2.2 10*3/uL (ref 0.7–4.0)
MCH: 21.9 pg — ABNORMAL LOW (ref 26.0–34.0)
MCHC: 30.6 g/dL (ref 30.0–36.0)
MCV: 71.7 fL — ABNORMAL LOW (ref 80.0–100.0)
Monocytes Absolute: 0.4 10*3/uL (ref 0.1–1.0)
Monocytes Relative: 6 %
Neutro Abs: 2.8 10*3/uL (ref 1.7–7.7)
Neutrophils Relative %: 51 %
Platelet Count: 230 10*3/uL (ref 150–400)
RBC: 5.75 MIL/uL — ABNORMAL HIGH (ref 3.87–5.11)
RDW: 23.2 % — ABNORMAL HIGH (ref 11.5–15.5)
WBC Count: 5.5 10*3/uL (ref 4.0–10.5)
nRBC: 0 % (ref 0.0–0.2)

## 2020-04-25 LAB — RETIC PANEL
Immature Retic Fract: 13.5 % (ref 2.3–15.9)
RBC.: 5.71 MIL/uL — ABNORMAL HIGH (ref 3.87–5.11)
Retic Count, Absolute: 72.5 10*3/uL (ref 19.0–186.0)
Retic Ct Pct: 1.3 % (ref 0.4–3.1)
Reticulocyte Hemoglobin: 27.8 pg — ABNORMAL LOW (ref >27.9)

## 2020-04-25 LAB — FERRITIN: Ferritin: 83 ng/mL (ref 11–307)

## 2020-05-25 ENCOUNTER — Other Ambulatory Visit: Payer: Self-pay

## 2020-05-25 ENCOUNTER — Ambulatory Visit: Payer: 59 | Admitting: Family Medicine

## 2020-05-25 ENCOUNTER — Encounter: Payer: Self-pay | Admitting: Family Medicine

## 2020-05-25 VITALS — BP 134/82 | HR 100 | Temp 98.0°F | Resp 16 | Wt 240.8 lb

## 2020-05-25 DIAGNOSIS — R600 Localized edema: Secondary | ICD-10-CM | POA: Diagnosis not present

## 2020-05-25 DIAGNOSIS — R030 Elevated blood-pressure reading, without diagnosis of hypertension: Secondary | ICD-10-CM

## 2020-05-25 DIAGNOSIS — D500 Iron deficiency anemia secondary to blood loss (chronic): Secondary | ICD-10-CM

## 2020-05-25 DIAGNOSIS — Z800 Family history of malignant neoplasm of digestive organs: Secondary | ICD-10-CM | POA: Diagnosis not present

## 2020-05-25 DIAGNOSIS — N920 Excessive and frequent menstruation with regular cycle: Secondary | ICD-10-CM

## 2020-05-25 MED ORDER — HYDROCHLOROTHIAZIDE 25 MG PO TABS
12.5000 mg | ORAL_TABLET | Freq: Every day | ORAL | 3 refills | Status: DC | PRN
Start: 2020-05-25 — End: 2020-10-24

## 2020-05-25 NOTE — Progress Notes (Signed)
Subjective  CC:  Chief Complaint  Patient presents with  . Hypertension    Does not check BP at home, but states she is routinely at the doctor and BP has been stable   . iron deficiency     States that since starting iron infusions she has noticed an increase in energy - happy with these results   . Edema    States that swelling is gone, denies starting hctz    HPI: Michele Roman is a 50 y.o. female who presents to the office today to address the problems listed above in the chief complaint.  Hypertension f/u: doing better.  I reviewed readings from recent office visits and all remaining in the normal to prehypertensive range.  She has not needed antihypertensive medication.  No chest pain or shortness of breath.  Trying to follow a low-salt diet.  Iron deficiency anemia status post 5 IV iron infusion therapies.  Most recent lab work from last week shows normal hemoglobin with mildly low ferritin levels.  Much improved.  Energy level is back to normal.  No longer having pica.  No longer having significant lower extremity edema  Lower extremity edema: Related to anemia and much improved.  Has never needed diuretic.  Health maintenance: Declines Covid vaccinations.  Defers colonoscopy at this time.   Assessment  1. Prehypertension   2. Family history of colon cancer   3. Bilateral lower extremity edema   4. Morbid obesity ([place])   5. Iron deficiency anemia due to chronic blood loss   6. Menorrhagia with regular cycle      Plan    Prehypertension f/u: BP control is normal mildly elevated.  She will continue to monitor, continue low-salt diet.  Recheck in 6 months.  No indication for antihypertensives at this time.  Edema f/u: Much improved since treating her anemia.  Continue low-salt diet.  She can use 12.5 to 25 mg of HCTZ if needed if swelling returns.  Can monitor over the warmer months.  Obesity: Recommend weight loss.  Iron deficiency anemia treated with IV iron  infusions.  Fortunately she is also had endometrial ablation and removal of an endometrial polyp.  Menorrhagia should be resolved.  Family history of colon cancer: Will return for complete physical.  Will discuss colonoscopy at that time again.   Education regarding management of these chronic disease states was given. Management strategies discussed on successive visits include dietary and exercise recommendations, goals of achieving and maintaining IBW, and lifestyle modifications aiming for adequate sleep and minimizing stressors.   Follow up: 6 months for complete physical  No orders of the defined types were placed in this encounter.  Meds ordered this encounter  Medications  . hydrochlorothiazide (HYDRODIURIL) 25 MG tablet    Sig: Take 0.5-1 tablets (12.5-25 mg total) by mouth daily as needed (leg swelling).    Dispense:  90 tablet    Refill:  3      BP Readings from Last 3 Encounters:  05/25/20 134/82  04/25/20 (!) 126/91  04/15/20 117/76   Wt Readings from Last 3 Encounters:  05/25/20 240 lb 12.8 oz (109.2 kg)  04/25/20 242 lb 12.8 oz (110.1 kg)  03/31/20 247 lb 12 oz (112.4 kg)    Lab Results  Component Value Date   CHOL 203 (H) 01/28/2020   CHOL 213 (H) 06/10/2018   CHOL 211 (H) 06/14/2017   Lab Results  Component Value Date   HDL 34 (L) 01/28/2020   HDL  39.80 06/10/2018   HDL 38 (L) 06/14/2017   Lab Results  Component Value Date   LDLCALC 138 (H) 01/28/2020   LDLCALC 148 (H) 06/10/2018   LDLCALC 153 (H) 06/14/2017   Lab Results  Component Value Date   TRIG 179 (H) 01/28/2020   TRIG 126.0 06/10/2018   TRIG 92 06/14/2017   Lab Results  Component Value Date   CHOLHDL 6.0 (H) 01/28/2020   CHOLHDL 5 06/10/2018   CHOLHDL 5.6 (H) 06/14/2017   No results found for: LDLDIRECT Lab Results  Component Value Date   CREATININE 0.82 04/25/2020   BUN 9 04/25/2020   NA 142 04/25/2020   K 4.4 04/25/2020   CL 110 04/25/2020   CO2 23 04/25/2020    The  10-year ASCVD risk score Mikey Bussing DC Jr., et al., 2013) is: 7.7%   Values used to calculate the score:     Age: 61 years     Sex: Female     Is Non-Hispanic African American: Yes     Diabetic: No     Tobacco smoker: No     Systolic Blood Pressure: 409 mmHg     Is BP treated: Yes     HDL Cholesterol: 34 mg/dL     Total Cholesterol: 203 mg/dL  I reviewed the patients updated PMH, FH, and SocHx.    Patient Active Problem List   Diagnosis Date Noted  . Iron deficiency anemia due to chronic blood loss 03/04/2020  . Bilateral lower extremity edema 02/22/2020  . Morbid obesity ([place]) 02/22/2020  . Family history of colon cancer 06/10/2018  . Anemia, iron deficiency 06/25/2013  . Fatty liver 04/15/2009    Allergies: Hydrocodone  Social History: Patient  reports that she has never smoked. She has never used smokeless tobacco. She reports current alcohol use. She reports that she does not use drugs.  Current Meds  Medication Sig  . b complex vitamins tablet Take 1 tablet by mouth daily.  . Cholecalciferol (D3 ADULT PO) Take 1 tablet by mouth daily.  . Multiple Vitamins-Minerals (MULTIVITAMIN ADULT PO) Take 1 tablet by mouth daily.    Review of Systems: Cardiovascular: negative for chest pain, palpitations, leg swelling, orthopnea Respiratory: negative for SOB, wheezing or persistent cough Gastrointestinal: negative for abdominal pain Genitourinary: negative for dysuria or gross hematuria  Objective  Vitals: BP 134/82   Pulse 100   Temp 98 F (36.7 C) (Temporal)   Resp 16   Wt 240 lb 12.8 oz (109.2 kg)   SpO2 98%   BMI 41.33 kg/m  General: no acute distress  Psych:  Alert and oriented, normal mood and affect HEENT:  Normocephalic, atraumatic, supple neck  Cardiovascular:  RRR without murmur.  Trace lower extremity edema Respiratory:  Good breath sounds bilaterally, CTAB with normal respiratory effort   Commons side effects, risks, benefits, and alternatives for medications  and treatment plan prescribed today were discussed, and the patient expressed understanding of the given instructions. Patient is instructed to call or message via MyChart if he/she has any questions or concerns regarding our treatment plan. No barriers to understanding were identified. We discussed Red Flag symptoms and signs in detail. Patient expressed understanding regarding what to do in case of urgent or emergency type symptoms.   Medication list was reconciled, printed and provided to the patient in AVS. Patient instructions and summary information was reviewed with the patient as documented in the AVS. This note was prepared with assistance of Dragon voice recognition software. Occasional wrong-word  or sound-a-like substitutions may have occurred due to the inherent limitations of voice recognition software  This visit occurred during the SARS-CoV-2 public health emergency.  Safety protocols were in place, including screening questions prior to the visit, additional usage of staff PPE, and extensive cleaning of exam room while observing appropriate contact time as indicated for disinfecting solutions.

## 2020-05-25 NOTE — Patient Instructions (Signed)
Please return in 6 months for your annual complete physical; please come fasting.  Take care!  If you have any questions or concerns, please don't hesitate to send me a message via MyChart or call the office at [phone]. Thank you for visiting with [place] today! It's our pleasure caring for you.

## 2020-10-24 ENCOUNTER — Other Ambulatory Visit: Payer: Self-pay | Admitting: Hematology and Oncology

## 2020-10-24 ENCOUNTER — Inpatient Hospital Stay (HOSPITAL_BASED_OUTPATIENT_CLINIC_OR_DEPARTMENT_OTHER): Payer: 59 | Admitting: Hematology and Oncology

## 2020-10-24 ENCOUNTER — Other Ambulatory Visit: Payer: Self-pay

## 2020-10-24 ENCOUNTER — Encounter: Payer: Self-pay | Admitting: Hematology and Oncology

## 2020-10-24 ENCOUNTER — Inpatient Hospital Stay: Payer: 59 | Attending: Hematology and Oncology

## 2020-10-24 VITALS — BP 143/88 | HR 89 | Temp 98.4°F | Resp 18 | Wt 245.4 lb

## 2020-10-24 DIAGNOSIS — D509 Iron deficiency anemia, unspecified: Secondary | ICD-10-CM | POA: Insufficient documentation

## 2020-10-24 DIAGNOSIS — E669 Obesity, unspecified: Secondary | ICD-10-CM

## 2020-10-24 DIAGNOSIS — D500 Iron deficiency anemia secondary to blood loss (chronic): Secondary | ICD-10-CM

## 2020-10-24 DIAGNOSIS — Z79899 Other long term (current) drug therapy: Secondary | ICD-10-CM | POA: Diagnosis not present

## 2020-10-24 DIAGNOSIS — N920 Excessive and frequent menstruation with regular cycle: Secondary | ICD-10-CM | POA: Diagnosis not present

## 2020-10-24 LAB — CBC WITH DIFFERENTIAL (CANCER CENTER ONLY)
Abs Immature Granulocytes: 0.02 10*3/uL (ref 0.00–0.07)
Basophils Absolute: 0 10*3/uL (ref 0.0–0.1)
Basophils Relative: 0 %
Eosinophils Absolute: 0 10*3/uL (ref 0.0–0.5)
Eosinophils Relative: 1 %
HCT: 40.8 % (ref 36.0–46.0)
Hemoglobin: 13.3 g/dL (ref 12.0–15.0)
Immature Granulocytes: 0 %
Lymphocytes Relative: 44 %
Lymphs Abs: 3.3 10*3/uL (ref 0.7–4.0)
MCH: 23.8 pg — ABNORMAL LOW (ref 26.0–34.0)
MCHC: 32.6 g/dL (ref 30.0–36.0)
MCV: 73.1 fL — ABNORMAL LOW (ref 80.0–100.0)
Monocytes Absolute: 0.5 10*3/uL (ref 0.1–1.0)
Monocytes Relative: 6 %
Neutro Abs: 3.7 10*3/uL (ref 1.7–7.7)
Neutrophils Relative %: 49 %
Platelet Count: 231 10*3/uL (ref 150–400)
RBC: 5.58 MIL/uL — ABNORMAL HIGH (ref 3.87–5.11)
RDW: 15.9 % — ABNORMAL HIGH (ref 11.5–15.5)
WBC Count: 7.6 10*3/uL (ref 4.0–10.5)
nRBC: 0 % (ref 0.0–0.2)

## 2020-10-24 LAB — CMP (CANCER CENTER ONLY)
ALT: 10 U/L (ref 0–44)
AST: 14 U/L — ABNORMAL LOW (ref 15–41)
Albumin: 3.8 g/dL (ref 3.5–5.0)
Alkaline Phosphatase: 44 U/L (ref 38–126)
Anion gap: 6 (ref 5–15)
BUN: 10 mg/dL (ref 6–20)
CO2: 25 mmol/L (ref 22–32)
Calcium: 9 mg/dL (ref 8.9–10.3)
Chloride: 108 mmol/L (ref 98–111)
Creatinine: 0.85 mg/dL (ref 0.44–1.00)
GFR, Estimated: >60 mL/min (ref >60)
Glucose, Bld: 96 mg/dL (ref 70–99)
Potassium: 3.7 mmol/L (ref 3.5–5.1)
Sodium: 139 mmol/L (ref 135–145)
Total Bilirubin: 0.4 mg/dL (ref 0.3–1.2)
Total Protein: 6.6 g/dL (ref 6.5–8.1)

## 2020-10-24 LAB — RETIC PANEL
Immature Retic Fract: 26.2 % — ABNORMAL HIGH (ref 2.3–15.9)
RBC.: 5.67 MIL/uL — ABNORMAL HIGH (ref 3.87–5.11)
Retic Count, Absolute: 118.5 10*3/uL (ref 19.0–186.0)
Retic Ct Pct: 2.1 % (ref 0.4–3.1)
Reticulocyte Hemoglobin: 27.3 pg — ABNORMAL LOW (ref >27.9)

## 2020-10-24 LAB — IRON AND TIBC
Iron: 78 ug/dL (ref 41–142)
Saturation Ratios: 32 % (ref 21–57)
TIBC: 249 ug/dL (ref 236–444)
UIBC: 170 ug/dL (ref 120–384)

## 2020-10-24 LAB — FERRITIN: Ferritin: 57 ng/mL (ref 11–307)

## 2020-10-24 NOTE — Progress Notes (Signed)
[place] Telephone:(336) [phone]   Fax:(336) [phone]  PROGRESS NOTE  Patient Care Team: Leamon Arnt, MD as PCP - General (Family Medicine) Jonna Clark, MD as Referring Physician (Obstetrics and Gynecology)  Hematological/Oncological History # Iron Deficiency Anemia 2/2 to GYN Blood Loss 06/25/2013: WBC 6.7, Hgb 13.1, MCV 71, Plt 266 06/14/2017: WBC 6.2, Hgb 11.2, MCV 69.3, Plt 336 01/28/2020: WBC 7.1, Hgb 9.7, MCV 64.6, Plt 353. Iron 23, TIBC 389, Tsat 6%, Ferritin 3 02/22/2020: WBC 5.7, Hgb 9.0, MCV 65.2, Plt 366. Iron 15, TIBC 334, Tsat 4%, Ferritin 3 03/04/2020: establish care with Dr. Lorenso Courier  12/10-1/14/2022: received 5 doses of IV sucrose '200mg'$  04/25/2020: WBC 5.5, Hgb 12.6, MCV 71.7, Plt 230 10/24/2020: WBC 7.6, Hgb 13.3, MCV 73.1, Plt 231  Interval History:  Michele Roman 50 y.o. female with medical history significant for iron deficiency anemia 2/2 to GYN blood loss who presents for a follow up visit. The patient's last visit was on 04/25/2020 at which time she established care. In the interim since the last visit she has continued to take her blood builders nutritional supplement with iron.  On exam today Mrs. Schilz notes her menstrual cycles have been considerably lighter since she underwent the ablation.  She notes that they restarted in March 2022 but now they do not even last 2 days.  She notes that her energy level is about a 9 out of 10 and she is feeling quite good.  She has been trying to increase her intake of iron rich foods but is disheartened that she is gaining weight.  She notes that her appetite has been so-so.  She otherwise denies having any fevers, chills, sweats, nausea, running or diarrhea.  A full 10 point ROS is listed below  MEDICAL HISTORY:  Past Medical History:  Diagnosis Date   Endometrial polyp 02/22/2020   Family history of colon cancer 06/10/2018   Father age 30   H/O colonoscopy 03/01/09   Menorrhagia with regular cycle  02/22/2020   Treated with endometrial ablation January 2022    SURGICAL HISTORY: Past Surgical History:  Procedure Laterality Date   APPENDECTOMY  1997   CERVICAL POLYPECTOMY  04/2020   CESAREAN [place]   Right foot surgery  2008, 2011   hardware in the foot    SOCIAL HISTORY: Social History   Socioeconomic History   Marital status: Married    Spouse name: Public librarian   Number of children: 2   Years of education: Not on file   Highest education level: Not on file  Occupational History   Occupation: Engineer, agricultural    Comment: [employer]  Tobacco Use   Smoking status: Never   Smokeless tobacco: Never  Substance and Sexual Activity   Alcohol use: Yes    Comment: Rarely    Drug use: Never   Sexual activity: Yes    Partners: Male    Birth control/protection: Surgical    Comment: tubal ligation  Other Topics Concern   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    Social Determinants of Health   Financial Resource Strain: Not on file  Food Insecurity: Not on file  Transportation Needs: Not on file  Physical Activity: Not on file  Stress: Not on file  Social Connections: Not on file  Intimate Partner Violence: Not on file    FAMILY HISTORY: Family History  Problem Relation Age  of Onset   Hypertension Mother    Heart disease Mother 55   Diabetes Mother    Hyperlipidemia Mother    Hypertension Brother    Colon cancer Father    Heart defect Father    Healthy Daughter    Healthy Daughter    Autoimmune disease Sister    Breast cancer Paternal Aunt     ALLERGIES:  is allergic to hydrocodone.  MEDICATIONS:  Current Outpatient Medications  Medication Sig Dispense Refill   b complex vitamins tablet Take 1 tablet by mouth daily.     Multiple Vitamins-Minerals (MULTIVITAMIN ADULT PO) Take 1 tablet by mouth daily.     No current facility-administered medications for this visit.    REVIEW OF  SYSTEMS:   Constitutional: ( - ) fevers, ( - )  chills , ( - ) night sweats Eyes: ( - ) blurriness of vision, ( - ) double vision, ( - ) watery eyes Ears, nose, mouth, throat, and face: ( - ) mucositis, ( - ) sore throat Respiratory: ( - ) cough, ( - ) dyspnea, ( - ) wheezes Cardiovascular: ( - ) palpitation, ( - ) chest discomfort, ( - ) lower extremity swelling Gastrointestinal:  ( - ) nausea, ( - ) heartburn, ( - ) change in bowel habits Skin: ( - ) abnormal skin rashes Lymphatics: ( - ) new lymphadenopathy, ( - ) easy bruising Neurological: ( - ) numbness, ( - ) tingling, ( - ) new weaknesses Behavioral/Psych: ( - ) mood change, ( - ) new changes  All other systems were reviewed with the patient and are negative.  PHYSICAL EXAMINATION:  Vitals:   10/24/20 1129  BP: (!) 143/88  Pulse: 89  Resp: 18  Temp: 98.4 F (36.9 C)  SpO2: 100%   Filed Weights   10/24/20 1129  Weight: 245 lb 7 oz (111.3 kg)    GENERAL: well appearing middle aged [place] American female alert, no distress and comfortable SKIN: skin color, texture, turgor are normal, no rashes or significant lesions EYES: conjunctiva are pink and non-injected, sclera clear LUNGS: clear to auscultation and percussion with normal breathing effort HEART: regular rate & rhythm and no murmurs and no lower extremity edema Musculoskeletal: no cyanosis of digits and no clubbing  PSYCH: alert & oriented x 3, fluent speech NEURO: no focal motor/sensory deficits  LABORATORY DATA:  I have reviewed the data as listed CBC Latest Ref Rng & Units 10/24/2020 04/25/2020 02/22/2020  WBC 4.0 - 10.5 K/uL 7.6 5.5 5.7  Hemoglobin 12.0 - 15.0 g/dL 13.3 12.6 9.0(L)  Hematocrit 36.0 - 46.0 % 40.8 41.2 31.6(L)  Platelets 150 - 400 K/uL 231 230 366    CMP Latest Ref Rng & Units 10/24/2020 04/25/2020 01/28/2020  Glucose 70 - 99 mg/dL 96 87 100(H)  BUN 6 - 20 mg/dL '10 9 7  '$ Creatinine 0.44 - 1.00 mg/dL 0.85 0.82 0.78  Sodium 135 - 145 mmol/L  139 142 139  Potassium 3.5 - 5.1 mmol/L 3.7 4.4 3.7  Chloride 98 - 111 mmol/L 108 110 106  CO2 22 - 32 mmol/L '25 23 25  '$ Calcium 8.9 - 10.3 mg/dL 9.0 8.8(L) 8.8  Total Protein 6.5 - 8.1 g/dL 6.6 6.3(L) 6.5  Total Bilirubin 0.3 - 1.2 mg/dL 0.4 0.2(L) 0.5  Alkaline Phos 38 - 126 U/L 44 41 -  AST 15 - 41 U/L 14(L) 15 15  ALT 0 - 44 U/L '10 9 9    '$ RADIOGRAPHIC STUDIES: No results  found.  ASSESSMENT & PLAN SILAS MICHIELS 50 y.o. female with medical history significant for iron deficiency anemia 2/2 to GYN blood loss who presents for a follow up visit.  After review the labs, review the records, schedule the patient the findings are most consistent with iron deficiency anemia adequately treated with IV iron therapy.  Fortunately in the interim she has had a endometrial polyp resection performed on 04/07/2020 which should help stem the source of the patient's bleeding.  Overall she tolerated the IV iron therapy well and has had a robust response in her hemoglobin with an increase to 13.3 (from 9.0 prior to her first clinic visit).  We are currently awaiting the results of her iron studies and if they are found to be normal we will have her back in approximately 6 months time.  If she remains iron deficient we will see her back sooner.  # Iron Deficiency Anemia 2/2 to GYN Blood Loss --findings consistent with iron deficiency anemia 2/2 to GYN bleeding --will order repeat iron panel, ferritin, reticulocyte count, CBC, CMP  --patient completed  IV iron sucrose 200 mg q7 days x 5 doses on 04/15/2020.  --patient has established care with OB/GYN, procedure for endometrial polyp resection performed on 04/07/2020. Her periods have markedly improved since that time.  --encourage patient to reschedule colonoscopy cancelled in 2020. --RTC PRN assuming her iron stores are adequate.  No orders of the defined types were placed in this encounter.  All questions were answered. The patient knows to call the clinic with  any problems, questions or concerns.  A total of more than 30 minutes were spent on this encounter and over half of that time was spent on counseling and coordination of care as outlined above.   Ledell Peoples, MD Department of Hematology/Oncology [place] at [facility] Phone: [phone] Pager: [phone] Email: [email or web address]  10/24/2020 12:07 PM

## 2020-11-30 ENCOUNTER — Encounter: Payer: 59 | Admitting: Family Medicine

## 2020-12-01 ENCOUNTER — Encounter: Payer: 59 | Admitting: Family Medicine

## 2021-01-02 ENCOUNTER — Ambulatory Visit (INDEPENDENT_AMBULATORY_CARE_PROVIDER_SITE_OTHER): Payer: 59 | Admitting: Medical-Surgical

## 2021-01-02 ENCOUNTER — Encounter: Payer: Self-pay | Admitting: Medical-Surgical

## 2021-01-02 ENCOUNTER — Other Ambulatory Visit: Payer: Self-pay

## 2021-01-02 VITALS — BP 136/88 | HR 87 | Resp 20 | Ht 64.0 in | Wt 243.4 lb

## 2021-01-02 DIAGNOSIS — Z2821 Immunization not carried out because of patient refusal: Secondary | ICD-10-CM | POA: Diagnosis not present

## 2021-01-02 DIAGNOSIS — Z1211 Encounter for screening for malignant neoplasm of colon: Secondary | ICD-10-CM

## 2021-01-02 DIAGNOSIS — Z131 Encounter for screening for diabetes mellitus: Secondary | ICD-10-CM

## 2021-01-02 DIAGNOSIS — Z7689 Persons encountering health services in other specified circumstances: Secondary | ICD-10-CM

## 2021-01-02 DIAGNOSIS — Z1329 Encounter for screening for other suspected endocrine disorder: Secondary | ICD-10-CM

## 2021-01-02 DIAGNOSIS — Z0000 Encounter for general adult medical examination without abnormal findings: Secondary | ICD-10-CM | POA: Diagnosis not present

## 2021-01-02 DIAGNOSIS — R635 Abnormal weight gain: Secondary | ICD-10-CM

## 2021-01-02 DIAGNOSIS — R0781 Pleurodynia: Secondary | ICD-10-CM

## 2021-01-02 DIAGNOSIS — R030 Elevated blood-pressure reading, without diagnosis of hypertension: Secondary | ICD-10-CM

## 2021-01-02 NOTE — Patient Instructions (Signed)
Macro diet Weigh and log foods Add strength training If no results in 4-8 weeks, return for discussion of medications to help  Weight loss tips and tricks:  1.  Make sure to drink at least 64 ounces of water every day. 2.  Avoid alcohol. 3.  Avoid eating within 3 hours of going to bed. 4.  Cut out sugary drinks such as sweet tea, regular sodas, energy drinks, etc. 5.  Make sure you are getting enough sleep every night. 6.  Start making changes by cutting back portion sizes by 1/3. 7.  Keep a food diary to help identify areas for improvement and promote awareness of bad habits. 8.  Increase your activity.  Choose something you like to do that is fun for you so you are more likely to stick with it. 9.  Do not forget your protein! 10.  Measure your neck, upper arms, waist, hips, and thighs and write those measurements down somewhere before you start your weight loss journey.  Changes in your measurements will tell a far more accurate story than the number on a scale. 11.  As you go, pay attention to how your clothes fit! 12.  Weigh yourself as often or as little as you need to.  Some folks do better weighing every day while others do better with once a week. 13.  Do not get discouraged!  Weight loss efforts are meant to be lifestyle changes.  Once you stop a medication (if you are taking 1), your behaviors and habits will determine if you maintain your weight loss or not.  For those on medications:  1.  Take your medication as prescribed every day first thing in the morning. 2.  Common side effects include nausea and constipation.  To combat this, increased your daily water consumption.  Consider adding in a stool softener (available OTC) if needed.  Also increase fiber intake with vegetables and fruits. 3.  If you have any side effects or concerns while taking medication, please do not hesitate to reach out to [place] here at the office. 4.  While on weight loss medications, we do require you to  follow-up every 4 weeks with an in office visit.  Refills will not be called in early on controlled substances.  Good luck on your weight loss journey!  Have faith in your self and you will reach your goals!

## 2021-01-02 NOTE — Progress Notes (Signed)
New Patient Office Visit  Subjective:  Patient ID: Michele Roman, female    DOB: 1970/09/12  Age: 50 y.o. MRN: [id number]  CC:  Chief Complaint  Patient presents with   Establish Care    HPI Michele Roman presents to establish care.  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  She is requesting to have a physical today.  History of pre-hypertension but not currently on any medications to treat this.  Does note that she has some intermittent right-sided pain after sleeping on the right side but has been unable to find specific cause for this.  Right-sided pain only happens after sleeping on that side and is not prompted or reproducible with other activities.  Past Medical History:  Diagnosis Date   Endometrial polyp 02/22/2020   Family history of colon cancer 06/10/2018   Father age 40   H/O colonoscopy 03/01/09   Menorrhagia with regular cycle 02/22/2020   Treated with endometrial ablation January 2022    Past Surgical History:  Procedure Laterality Date   APPENDECTOMY  1997   CERVICAL POLYPECTOMY  04/2020   CESAREAN SECTION  [place]   Right foot surgery  2008, 2011   hardware in the foot    Family History  Problem Relation Age of Onset   Hypertension Mother    Heart disease Mother 87   Diabetes Mother    Hyperlipidemia Mother    Hypertension Brother    Colon cancer Father    Heart defect Father    Healthy Daughter    Healthy Daughter    Autoimmune disease Sister    Breast cancer Paternal Aunt     Social History   Socioeconomic History   Marital status: Married    Spouse name: Public librarian   Number of children: 2   Years of education: Not on file   Highest education level: Not on file  Occupational History   Occupation: Engineer, agricultural    Comment: [employer]  Tobacco Use   Smoking status: Never   Smokeless tobacco: Never  Substance and Sexual Activity   Alcohol use: Yes    Comment: Rarely    Drug use: Never   Sexual  activity: Yes    Partners: Male    Birth control/protection: Surgical    Comment: tubal ligation  Other Topics Concern   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    Social Determinants of Health   Financial Resource Strain: Not on file  Food Insecurity: Not on file  Transportation Needs: Not on file  Physical Activity: Not on file  Stress: Not on file  Social Connections: Not on file  Intimate Partner Violence: Not on file    ROS Review of Systems  Constitutional:  Negative for chills, fatigue, fever and unexpected weight change.  HENT:  Negative for congestion, rhinorrhea, sinus pressure and sore throat.   Respiratory:  Negative for cough, chest tightness and shortness of breath.   Cardiovascular:  Negative for chest pain, palpitations and leg swelling.  Gastrointestinal:  Negative for abdominal pain, constipation, diarrhea, nausea and vomiting.  Endocrine: Negative for cold intolerance and heat intolerance.  Genitourinary:  Negative for dysuria, frequency, urgency, vaginal bleeding and vaginal discharge.  Musculoskeletal:        Right sided lower rib/upper abdominal pain  Skin:  Negative for rash and wound.  Neurological:  Negative for dizziness, light-headedness  and headaches.  Hematological:  Does not bruise/bleed easily.  Psychiatric/Behavioral:  Negative for self-injury, sleep disturbance and suicidal ideas. The patient is not nervous/anxious.    Objective:   Today's Vitals: BP 136/88 (BP Location: Left Arm, Patient Position: Sitting, Cuff Size: Large)   Pulse 87   Resp 20   Ht 5\' 4"  (1.626 m)   Wt 243 lb 6.4 oz (110.4 kg)   SpO2 98%   BMI 41.78 kg/m   Physical Exam Vitals and nursing note reviewed.  Constitutional:      General: She is not in acute distress.    Appearance: Normal appearance. She is not ill-appearing.  HENT:     Head: Normocephalic and atraumatic.     Right Ear: Tympanic membrane normal.      Left Ear: Tympanic membrane normal.  Eyes:     Extraocular Movements: Extraocular movements intact.     Conjunctiva/sclera: Conjunctivae normal.     Pupils: Pupils are equal, round, and reactive to light.  Neck:     Thyroid: No thyromegaly.     Vascular: No carotid bruit or JVD.     Trachea: Trachea normal.  Cardiovascular:     Rate and Rhythm: Normal rate and regular rhythm.     Pulses: Normal pulses.     Heart sounds: Normal heart sounds. No murmur heard.   No friction rub. No gallop.  Pulmonary:     Effort: Pulmonary effort is normal. No respiratory distress.     Breath sounds: Normal breath sounds. No wheezing.  Abdominal:     General: Bowel sounds are normal. There is no distension.     Palpations: Abdomen is soft.     Tenderness: There is no abdominal tenderness. There is no guarding.  Musculoskeletal:        General: Normal range of motion.     Cervical back: Normal range of motion and neck supple.  Skin:    General: Skin is warm and dry.  Neurological:     Mental Status: She is alert and oriented to person, place, and time.     Cranial Nerves: No cranial nerve deficit.  Psychiatric:        Mood and Affect: Mood normal.        Behavior: Behavior normal.        Thought Content: Thought content normal.        Judgment: Judgment normal.    Assessment & Plan:   1. Encounter to establish care Reviewed available information and discussed care concerns with patient.    2. Influenza vaccine refused Reviewed the importance of vaccination but patient continued to decline.  3. Weight gain Discussed weight gain.  She has been exercising regularly but is only doing cardio.  Recommend weighing and logging foods to accurately track her intake.  Also recommend adding in strength/resistance training several times weekly to help build muscle and change body composition.  Discussed evaluating macro intake and aiming for high-protein.  Weight loss tips and tricks provided with AVS.   If no results with the addition of these lifestyle interventions, advised her to return for further discussion of potential medications that may help with weight loss.  4. Rib pain on right side Unclear etiology.  Pain is not reproducible today.  Suspect this may be related to muscle spasm or other musculoskeletal cause with lying on the right side.  Okay to treat conservatively with Tylenol/ibuprofen, heat, or ice.  5. Diabetes mellitus screening Checking hemoglobin A1c. - Hemoglobin A1c  6.  Thyroid disorder screen Checking TSH. - TSH  7. Annual physical exam CBC and CMP up-to-date.  Checking lipid panel today. - Lipid panel  8. Prehypertension Blood pressure continues to be borderline.  Continue lifestyle modifications and low-sodium diet.  Recommend weight loss and exercise modifications as above.  9. Colon cancer screening Discussed colon cancer screening options.  She would like to go ahead with referral to GI for colonoscopy. - Ambulatory referral to Gastroenterology  Outpatient Encounter Medications as of 01/02/2021  Medication Sig   b complex vitamins tablet Take 1 tablet by mouth daily.   Multiple Vitamins-Minerals (MULTIVITAMIN ADULT PO) Take 1 tablet by mouth daily.   No facility-administered encounter medications on file as of 01/02/2021.    Follow-up: Return in about 6 months (around 07/03/2021) for prehypertension follow up.   Clearnce Sorrel, DNP, APRN, FNP-BC [facility]

## 2021-01-03 LAB — HEMOGLOBIN A1C
Hgb A1c MFr Bld: 5.3 % of total Hgb (ref <5.7)
Mean Plasma Glucose: 105 mg/dL
eAG (mmol/L): 5.8 mmol/L

## 2021-01-03 LAB — LIPID PANEL
Cholesterol: 194 mg/dL (ref <200)
HDL: 28 mg/dL — ABNORMAL LOW (ref >=50)
LDL Cholesterol (Calc): 130 mg/dL (calc) — ABNORMAL HIGH
Non-HDL Cholesterol (Calc): 166 mg/dL (calc) — ABNORMAL HIGH (ref <130)
Total CHOL/HDL Ratio: 6.9 (calc) — ABNORMAL HIGH (ref <5.0)
Triglycerides: 221 mg/dL — ABNORMAL HIGH (ref <150)

## 2021-01-03 LAB — TSH: TSH: 1.83 mIU/L

## 2021-01-26 ENCOUNTER — Encounter: Payer: Self-pay | Admitting: Gastroenterology

## 2021-03-13 ENCOUNTER — Encounter: Payer: Self-pay | Admitting: Hematology and Oncology

## 2021-03-14 ENCOUNTER — Encounter: Payer: Self-pay | Admitting: Gastroenterology

## 2021-03-14 ENCOUNTER — Ambulatory Visit (AMBULATORY_SURGERY_CENTER): Payer: 59

## 2021-03-14 VITALS — Ht 64.0 in | Wt 240.0 lb

## 2021-03-14 DIAGNOSIS — Z800 Family history of malignant neoplasm of digestive organs: Secondary | ICD-10-CM

## 2021-03-14 NOTE — Progress Notes (Signed)
No egg or soy allergy known to patient  No issues known to pt with past sedation with any surgeries or procedures Patient denies ever being told they had issues or difficulty with intubation  No FH of Malignant Hyperthermia Pt is not on diet pills Pt is not on  home 02  Pt is not on blood thinners  Pt denies issues with constipation  No A fib or A flutter  Pt is NOT vaccinated  for Covid   Virtual previsit  Due to the COVID-19 pandemic we are asking patients to follow certain guidelines in [facility] and the [place] [facility]   Pt aware of COVID protocols and [facility] guidelines

## 2021-03-28 ENCOUNTER — Encounter: Payer: 59 | Admitting: Gastroenterology

## 2021-04-06 ENCOUNTER — Ambulatory Visit (AMBULATORY_SURGERY_CENTER): Payer: 59 | Admitting: Gastroenterology

## 2021-04-06 ENCOUNTER — Encounter: Payer: Self-pay | Admitting: Gastroenterology

## 2021-04-06 ENCOUNTER — Other Ambulatory Visit: Payer: Self-pay

## 2021-04-06 VITALS — BP 138/93 | HR 70 | Temp 98.2°F | Resp 17 | Ht 64.0 in | Wt 240.0 lb

## 2021-04-06 DIAGNOSIS — Z1211 Encounter for screening for malignant neoplasm of colon: Secondary | ICD-10-CM | POA: Diagnosis present

## 2021-04-06 DIAGNOSIS — Z800 Family history of malignant neoplasm of digestive organs: Secondary | ICD-10-CM | POA: Diagnosis not present

## 2021-04-06 MED ORDER — SODIUM CHLORIDE 0.9 % IV SOLN: 500.0000 mL | Freq: Once | INTRAVENOUS | Status: DC

## 2021-04-06 NOTE — Progress Notes (Signed)
Referring Provider: Samuel Bouche, NP Primary Care Physician:  Samuel Bouche, NP  Reason for Procedure:  Colon cancer screening   IMPRESSION:  Need for colon cancer screening Appropriate candidate for monitored anesthesia care  PLAN: Colonoscopy in the [place] today   HPI: Michele Roman is a 50 y.o. female presents for screening colonoscopy.  No prior colonoscopy or colon cancer screening.  No baseline GI symptoms.   Father and brother with colon cancer prior to age 82. No other known family history of colon cancer or polyps. No family history of uterine/endometrial cancer, pancreatic cancer or gastric/stomach cancer.   Past Medical History:  Diagnosis Date   Anemia    Endometrial polyp 02/22/2020   Family history of colon cancer 06/10/2018   Father age 31   H/O colonoscopy 03/01/2009   Menorrhagia with regular cycle 02/22/2020   Treated with endometrial ablation January 2022    Past Surgical History:  Procedure Laterality Date   APPENDECTOMY  1997   CERVICAL POLYPECTOMY  04/2020   CESAREAN SECTION  1999   COLONOSCOPY  2012   normal per pt   DILATION AND CURETTAGE, DIAGNOSTIC / THERAPEUTIC  04/2020   EXPLORATORY LAPAROTOMY  1999   Right foot surgery  2008, 2011   hardware in the foot   UTERINE ABLATION  04/2020    Current Outpatient Medications  Medication Sig Dispense Refill   b complex vitamins tablet Take 1 tablet by mouth daily.     FA-B6-B12-D-Omega 3-Phytoster (ANIMI-3/VITAMIN D PO) Take 5,000 Int'l Units/1.62m2 by mouth.     MILK THISTLE PO Take by mouth daily.     Multiple Vitamins-Minerals (MULTIVITAMIN ADULT PO) Take 1 tablet by mouth daily.     OVER THE COUNTER MEDICATION daily at 8 pm. BEET JUICE capsule     OVER THE COUNTER MEDICATION daily at 8 pm. MEGAFOOD BLOOD BUILDER     Current Facility-Administered Medications  Medication Dose Route Frequency Provider Last Rate Last Admin   0.9 %  sodium chloride infusion  500 mL Intravenous Once Thornton Park, MD        Allergies as of 04/06/2021 - Review Complete 04/06/2021  Allergen Reaction Noted   Hydrocodone  11/01/2013    Family History  Problem Relation Age of Onset   Hypertension Mother    Heart disease Mother 27   Diabetes Mother    Hyperlipidemia Mother    Colon polyps Father    Colon cancer Father    Heart defect Father    Autoimmune disease Sister    Colon cancer Brother    Hypertension Brother    Breast cancer Paternal 55    Healthy Daughter    Healthy Daughter    Esophageal cancer Neg Hx    Stomach cancer Neg Hx    Rectal cancer Neg Hx      Physical Exam: General:   Alert,  well-nourished, pleasant and cooperative in NAD Head:  Normocephalic and atraumatic. Eyes:  Sclera clear, no icterus.   Conjunctiva pink. Mouth:  No deformity or lesions.   Neck:  Supple; no masses or thyromegaly. Lungs:  Clear throughout to auscultation.   No wheezes. Heart:  Regular rate and rhythm; no murmurs. Abdomen:  Soft, non-tender, nondistended, normal bowel sounds, no rebound or guarding.  Msk:  Symmetrical. No boney deformities LAD: No inguinal or umbilical LAD Extremities:  No clubbing or edema. Neurologic:  Alert and  oriented x4;  grossly nonfocal Skin:  No obvious rash or bruise. Psych:  Alert  and cooperative. Normal mood and affect.     Studies/Results: No results found.    [name] L. Tarri Glenn, MD, MPH 04/06/2021, 9:11 AM

## 2021-04-06 NOTE — Progress Notes (Signed)
PT taken to PACU. Monitors in place. VSS. Report given to RN. 

## 2021-04-06 NOTE — Patient Instructions (Signed)
HANDOUTS PROVIDED ON: HIGH FIBER DIET & DIVERTICULOSIS  Your next colonoscopy should occur in 5 years.    You may resume your previous diet and medication schedule.  Thank you for allowing [place] to care for you today!!!   YOU HAD AN ENDOSCOPIC PROCEDURE TODAY AT [facility]:   Refer to the procedure report that was given to you for any specific questions about what was found during the examination.  If the procedure report does not answer your questions, please call your gastroenterologist to clarify.  If you requested that your care partner not be given the details of your procedure findings, then the procedure report has been included in a sealed envelope for you to review at your convenience later.  YOU SHOULD EXPECT: Some feelings of bloating in the abdomen. Passage of more gas than usual.  Walking can help get rid of the air that was put into your GI tract during the procedure and reduce the bloating. If you had a lower endoscopy (such as a colonoscopy or flexible sigmoidoscopy) you may notice spotting of blood in your stool or on the toilet paper. If you underwent a bowel prep for your procedure, you may not have a normal bowel movement for a few days.  Please Note:  You might notice some irritation and congestion in your nose or some drainage.  This is from the oxygen used during your procedure.  There is no need for concern and it should clear up in a day or so.  SYMPTOMS TO REPORT IMMEDIATELY:  Following lower endoscopy (colonoscopy or flexible sigmoidoscopy):  Excessive amounts of blood in the stool  Significant tenderness or worsening of abdominal pains  Swelling of the abdomen that is new, acute  Fever of 100F or higher  For urgent or emergent issues, a gastroenterologist can be reached at any hour by calling [phone]. Do not use MyChart messaging for urgent concerns.    DIET:  We do recommend a small meal at first, but then you may proceed to your regular  diet.  Drink plenty of fluids but you should avoid alcoholic beverages for 24 hours.  ACTIVITY:  You should plan to take it easy for the rest of today and you should NOT DRIVE or use heavy machinery until tomorrow (because of the sedation medicines used during the test).    FOLLOW UP: Our staff will call the number listed on your records Monday between 7:15 am and 8:15 am following your procedure to check on you and address any questions or concerns that you may have regarding the information given to you following your procedure. If we do not reach you, we will leave a message.  We will attempt to reach you two times.  During this call, we will ask if you have developed any symptoms of COVID 19. If you develop any symptoms (ie: fever, flu-like symptoms, shortness of breath, cough etc.) before then, please call [phone].  If you test positive for Covid 19 in the 2 weeks post procedure, please call and report this information to [place].    If any biopsies were taken you will be contacted by phone or by letter within the next 1-3 weeks.  Please call us at [phone] if you have not heard about the biopsies in 3 weeks.    SIGNATURES/CONFIDENTIALITY: You and/or your care partner have signed paperwork which will be entered into your electronic medical record.  These signatures attest to the fact that that the information above  on your After Visit Summary has been reviewed and is understood.  Full responsibility of the confidentiality of this discharge information lies with you and/or your care-partner.

## 2021-04-06 NOTE — Progress Notes (Signed)
Pt's states no medical or surgical changes since previsit or office visit. 

## 2021-04-06 NOTE — Op Note (Signed)
[name] Procedure Date: 04/06/2021 9:09 AM MRN: [id number] Endoscopist: Thornton Park MD, MD Age: 51 Referring MD:  Date of Birth: 1971/02/09 Gender: Female Account #: [id number] Procedure:                Colonoscopy Indications:              Screening for colorectal malignant neoplasm                           Father and brother with colon cancer before age 34                           Prior normal colonoscopy 10 years ago Medicines:                Monitored Anesthesia Care Procedure:                Pre-Anesthesia Assessment:                           - Prior to the procedure, a History and Physical                            was performed, and patient medications and                            allergies were reviewed. The patient's tolerance of                            previous anesthesia was also reviewed. The risks                            and benefits of the procedure and the sedation                            options and risks were discussed with the patient.                            All questions were answered, and informed consent                            was obtained. Prior Anticoagulants: The patient has                            taken no previous anticoagulant or antiplatelet                            agents. ASA Grade Assessment: II - A patient with                            mild systemic disease. After reviewing the risks                            and benefits, the patient was deemed in  satisfactory condition to undergo the procedure.                           After obtaining informed consent, the colonoscope                            was passed under direct vision. Throughout the                            procedure, the patient's blood pressure, pulse, and                            oxygen saturations were monitored continuously. The                            CF [id number] #[id number] was  introduced through the anus                            and advanced to the 3 cm into the ileum. A second                            forward view of the right colon was performed. The                            colonoscopy was performed without difficulty. The                            patient tolerated the procedure well. The quality                            of the bowel preparation was good. The terminal                            ileum, ileocecal valve, appendiceal orifice, and                            rectum were photographed. Scope In: 9:20:50 AM Scope Out: 1:77:93 AM Scope Withdrawal Time: 0 hours 8 minutes 54 seconds  Total Procedure Duration: 0 hours 15 minutes 57 seconds  Findings:                 The perianal and digital rectal examinations were                            normal.                           A single medium-mouthed diverticulum was found in                            the ascending colon.                           The remainder of the examined colonic mucosa  appeared normal.                           The terminal ileum appeared normal. Complications:            No immediate complications. Estimated Blood Loss:     Estimated blood loss: none. Impression:               - Single diverticulum in the ascending colon.                           - No polyps or mass. Recommendation:           - Patient has a contact number available for                            emergencies. The signs and symptoms of potential                            delayed complications were discussed with the                            patient. Return to normal activities tomorrow.                            Written discharge instructions were provided to the                            patient.                           - Resume previous diet. High fiber diet recommended.                           - Continue present medications.                           - Repeat  colonoscopy in 5 years for surveillance.                           - Emerging evidence supports eating a diet of                            fruits, vegetables, grains, calcium, and yogurt                            while reducing red meat and alcohol may reduce the                            risk of colon cancer.                           - Thank you for allowing me to be involved in your                            colon cancer prevention. Thornton Park MD, MD 04/06/2021 9:43:30 AM  This report has been signed electronically.

## 2021-04-10 ENCOUNTER — Telehealth: Payer: Self-pay

## 2021-04-10 ENCOUNTER — Telehealth: Payer: Self-pay | Admitting: *Deleted

## 2021-04-10 NOTE — Telephone Encounter (Signed)
No answer on 1st follow up call. Left voice message

## 2021-04-10 NOTE — Telephone Encounter (Signed)
°  Follow up Call-  Call back number 04/06/2021  Post procedure Call Back phone  # [phone]  Permission to leave phone message Yes  Some recent data might be hidden     Patient questions:  Do you have a fever, pain , or abdominal swelling? No. Pain Score  0 *  Have you tolerated food without any problems? Yes.    Have you been able to return to your normal activities? Yes.    Do you have any questions about your discharge instructions: Diet   No. Medications  No. Follow up visit  No.  Do you have questions or concerns about your Care? No.  Actions: * If pain score is 4 or above: No action needed, pain <4.  Have you developed a fever since your procedure? no  2.   Have you had an respiratory symptoms (SOB or cough) since your procedure? no  3.   Have you tested positive for COVID 19 since your procedure no  4.   Have you had any family members/close contacts diagnosed with the COVID 19 since your procedure?  no   If yes to any of these questions please route to Joylene John, RN and Joella Prince, RN

## 2021-07-02 NOTE — Progress Notes (Signed)
?  HPI with pertinent ROS:  ? ?CC: prehypertension follow up ? ?HPI: ?Pleasant 51 year old female presenting today for: ? ?Prehypertension ?Medication: None ?Compliant: Not applicable ?Side effects: Not applicable ?Checking BP at home: No but has machine at home ?Low sodium diet: Yes ?Exercise: Was doing treadmill and had lost 19 pounds but "fell off the wagon" and is not exercising now, knows that she should get back to it ?Concerning symptoms: None ? ?I reviewed the past medical history, family history, social history, surgical history, and allergies today and no changes were needed.  Please see the problem list section below in epic for further details. ? ? ?Physical exam:  ? ?General: Well Developed, well nourished, and in no acute distress.  ?Neuro: Alert and oriented x3.  ?HEENT: Normocephalic, atraumatic.  ?Skin: Warm and dry. ?Cardiac: Regular rate and rhythm, no murmurs rubs or gallops, no lower extremity edema.  ?Respiratory: Clear to auscultation bilaterally. Not using accessory muscles, speaking in full sentences. ? ?Impression and Recommendations:   ? ?1. Prehypertension ?Blood pressure slightly elevated on arrival however recheck was better at 136/84.  Advised to monitor blood pressures at home with goal of 130/80 or less.  If consistently running higher, we may need to discuss adding a medication to help control this.  Recommend following the [employer].  Resume regular intentional exercise at least 3 times weekly. Checking labs today. ? ?2. History of prediabetes ?Checking hemoglobin A1c. ? ?Return in about 6 months (around 01/02/2022) for Prehypertension follow-up. ?___________________________________________ ?Clearnce Sorrel, DNP, APRN, FNP-BC ?Primary Care and Sports Medicine ?[place] ?

## 2021-07-03 ENCOUNTER — Ambulatory Visit: Payer: 59 | Admitting: Medical-Surgical

## 2021-07-03 ENCOUNTER — Encounter: Payer: Self-pay | Admitting: Medical-Surgical

## 2021-07-03 VITALS — BP 136/84 | HR 77 | Resp 20 | Ht 64.0 in | Wt 233.3 lb

## 2021-07-03 DIAGNOSIS — Z87898 Personal history of other specified conditions: Secondary | ICD-10-CM | POA: Diagnosis not present

## 2021-07-03 DIAGNOSIS — R030 Elevated blood-pressure reading, without diagnosis of hypertension: Secondary | ICD-10-CM

## 2021-07-03 NOTE — Patient Instructions (Signed)

## 2021-07-04 LAB — COMPLETE METABOLIC PANEL WITH GFR
AG Ratio: 1.8 (calc) (ref 1.0–2.5)
ALT: 10 U/L (ref 6–29)
AST: 16 U/L (ref 10–35)
Albumin: 4.2 g/dL (ref 3.6–5.1)
Alkaline phosphatase (APISO): 40 U/L (ref 31–125)
BUN: 13 mg/dL (ref 7–25)
CO2: 24 mmol/L (ref 20–32)
Calcium: 9.1 mg/dL (ref 8.6–10.2)
Chloride: 107 mmol/L (ref 98–110)
Creat: 0.79 mg/dL (ref 0.50–0.99)
Globulin: 2.4 g/dL (calc) (ref 1.9–3.7)
Glucose, Bld: 97 mg/dL (ref 65–139)
Potassium: 4.1 mmol/L (ref 3.5–5.3)
Sodium: 140 mmol/L (ref 135–146)
Total Bilirubin: 0.4 mg/dL (ref 0.2–1.2)
Total Protein: 6.6 g/dL (ref 6.1–8.1)
eGFR: 92 mL/min/{1.73_m2} (ref >=60)

## 2021-07-04 LAB — CBC WITH DIFFERENTIAL/PLATELET
Absolute Monocytes: 390 cells/uL (ref 200–950)
Basophils Absolute: 30 cells/uL (ref 0–200)
Basophils Relative: 0.4 %
Eosinophils Absolute: 23 cells/uL (ref 15–500)
Eosinophils Relative: 0.3 %
HCT: 43.9 % (ref 35.0–45.0)
Hemoglobin: 13.8 g/dL (ref 11.7–15.5)
Lymphs Abs: 2970 cells/uL (ref 850–3900)
MCH: 23.9 pg — ABNORMAL LOW (ref 27.0–33.0)
MCHC: 31.4 g/dL — ABNORMAL LOW (ref 32.0–36.0)
MCV: 76.1 fL — ABNORMAL LOW (ref 80.0–100.0)
MPV: 10.8 fL (ref 7.5–12.5)
Monocytes Relative: 5.2 %
Neutro Abs: 4088 cells/uL (ref 1500–7800)
Neutrophils Relative %: 54.5 %
Platelets: 215 10*3/uL (ref 140–400)
RBC: 5.77 10*6/uL — ABNORMAL HIGH (ref 3.80–5.10)
RDW: 15.2 % — ABNORMAL HIGH (ref 11.0–15.0)
Total Lymphocyte: 39.6 %
WBC: 7.5 10*3/uL (ref 3.8–10.8)

## 2021-07-04 LAB — HEMOGLOBIN A1C
Hgb A1c MFr Bld: 5.5 % of total Hgb (ref <5.7)
Mean Plasma Glucose: 111 mg/dL
eAG (mmol/L): 6.2 mmol/L

## 2021-07-04 LAB — LIPID PANEL
Cholesterol: 190 mg/dL (ref <200)
HDL: 36 mg/dL — ABNORMAL LOW (ref >=50)
LDL Cholesterol (Calc): 129 mg/dL (calc) — ABNORMAL HIGH
Non-HDL Cholesterol (Calc): 154 mg/dL (calc) — ABNORMAL HIGH (ref <130)
Total CHOL/HDL Ratio: 5.3 (calc) — ABNORMAL HIGH (ref <5.0)
Triglycerides: 134 mg/dL (ref <150)

## 2021-10-10 LAB — HM PAP SMEAR: HM Pap smear: NEGATIVE

## 2021-12-01 LAB — HM MAMMOGRAPHY

## 2021-12-16 ENCOUNTER — Encounter: Payer: Self-pay | Admitting: Medical-Surgical

## 2022-01-01 NOTE — Progress Notes (Signed)
   Complete physical exam  Patient: Michele Roman   DOB: 01/20/1998   51 y.o. Female  MRN: [id number]  Subjective:    No chief complaint on file.   Michele Roman is a 51 y.o. female who presents today for a complete physical exam. She reports consuming a {diet types:17450} diet. {types:19826} She generally feels {DESC; WELL/FAIRLY WELL/POORLY:18703}. She reports sleeping {DESC; WELL/FAIRLY WELL/POORLY:18703}. She {does/does not:200015} have additional problems to discuss today.    Most recent fall risk assessment:    09/27/2021   10:42 AM  Fall Risk   Falls in the past year? 0  Number falls in past yr: 0  Injury with Fall? 0  Risk for fall due to : No Fall Risks  Follow up Falls evaluation completed     Most recent depression screenings:    09/27/2021   10:42 AM 08/18/2020   10:46 AM  PHQ 2/9 Scores  PHQ - 2 Score 0 0  PHQ- 9 Score 5     {VISON DENTAL STD PSA (Optional):27386}  {History (Optional):23778}  Patient Care Team: Samone Guhl, NP as PCP - General (Nurse Practitioner)   Outpatient Medications Prior to Visit  Medication Sig   fluticasone (FLONASE) 50 MCG/ACT nasal spray Place 2 sprays into both nostrils in the morning and at bedtime. After 7 days, reduce to once daily.   norgestimate-ethinyl estradiol (SPRINTEC 28) 0.25-35 MG-MCG tablet Take 1 tablet by mouth daily.   Nystatin POWD Apply liberally to affected area 2 times per day   spironolactone (ALDACTONE) 100 MG tablet Take 1 tablet (100 mg total) by mouth daily.   No facility-administered medications prior to visit.    ROS        Objective:     There were no vitals taken for this visit. {Vitals History (Optional):23777}  Physical Exam   No results found for any visits on 11/02/21. {Show previous labs (optional):23779}    Assessment & Plan:    Routine Health Maintenance and Physical Exam  Immunization History  Administered Date(s) Administered   DTaP 04/05/1999, 06/01/1999,  08/10/1999, 04/25/2000, 11/09/2003   Hepatitis A 09/05/2007, 09/10/2008   Hepatitis B 01/21/1999, 02/28/1999, 08/10/1999   HiB (PRP-OMP) 04/05/1999, 06/01/1999, 08/10/1999, 04/25/2000   IPV 04/05/1999, 06/01/1999, 01/29/2000, 11/09/2003   Influenza,inj,Quad PF,6+ Mos 12/11/2013   Influenza-Unspecified 03/12/2012   MMR 01/28/2001, 11/09/2003   Meningococcal Polysaccharide 09/10/2011   Pneumococcal Conjugate-13 04/25/2000   Pneumococcal-Unspecified 08/10/1999, 10/24/1999   Tdap 09/10/2011   Varicella 01/29/2000, 09/05/2007    Health Maintenance  Topic Date Due   HIV Screening  Never done   Hepatitis C Screening  Never done   INFLUENZA VACCINE  10/31/2021   PAP-Cervical Cytology Screening  11/02/2021 (Originally 01/20/2020)   PAP SMEAR-Modifier  11/02/2021 (Originally 01/20/2020)   TETANUS/TDAP  11/02/2021 (Originally 09/09/2021)   HPV VACCINES  Discontinued   COVID-19 Vaccine  Discontinued    Discussed health benefits of physical activity, and encouraged her to engage in regular exercise appropriate for her age and condition.  Problem List Items Addressed This Visit   None Visit Diagnoses     Annual physical exam    -  Primary   Cervical cancer screening       Need for Tdap vaccination          No follow-ups on file.     Reese Senk, NP   

## 2022-01-02 ENCOUNTER — Ambulatory Visit: Payer: 59 | Admitting: Medical-Surgical

## 2022-01-02 DIAGNOSIS — R030 Elevated blood-pressure reading, without diagnosis of hypertension: Secondary | ICD-10-CM

## 2022-01-02 DIAGNOSIS — Z91199 Patient's noncompliance with other medical treatment and regimen due to unspecified reason: Secondary | ICD-10-CM

## 2022-05-11 ENCOUNTER — Encounter: Payer: Self-pay | Admitting: Medical-Surgical

## 2022-05-11 ENCOUNTER — Ambulatory Visit: Payer: 59 | Admitting: Medical-Surgical

## 2022-05-11 VITALS — BP 140/102 | HR 86 | Resp 20 | Ht 64.0 in | Wt 232.6 lb

## 2022-05-11 DIAGNOSIS — Z13220 Encounter for screening for lipoid disorders: Secondary | ICD-10-CM

## 2022-05-11 DIAGNOSIS — Z0000 Encounter for general adult medical examination without abnormal findings: Secondary | ICD-10-CM

## 2022-05-11 DIAGNOSIS — D500 Iron deficiency anemia secondary to blood loss (chronic): Secondary | ICD-10-CM

## 2022-05-11 DIAGNOSIS — R030 Elevated blood-pressure reading, without diagnosis of hypertension: Secondary | ICD-10-CM

## 2022-05-11 NOTE — Progress Notes (Signed)
Complete physical exam  Patient: Michele Roman   DOB: April 14, 1970   52 y.o. Female  MRN: [id number]  Subjective:    Chief Complaint  Patient presents with   Annual Exam    Michele Roman is a 52 y.o. female who presents today for a complete physical exam. She reports consuming a general diet.  Walking at home for 45 minutes several times weekly.  She generally feels well. She reports sleeping well. She does not have additional problems to discuss today.    Most recent fall risk assessment:    05/11/2022   10:18 AM  Fall Risk   Falls in the past year? 0  Number falls in past yr: 0  Injury with Fall? 0  Risk for fall due to : No Fall Risks  Follow up Falls evaluation completed     Most recent depression screenings:    05/11/2022   10:25 AM 07/03/2021    9:56 AM  PHQ 2/9 Scores  PHQ - 2 Score 0 0    Vision:Within last year and Dental: No current dental problems and Receives regular dental care    Patient Care Team: Samuel Bouche, NP as PCP - General (Nurse Practitioner) Jonna Clark, MD as Referring Physician (Obstetrics and Gynecology)   Outpatient Medications Prior to Visit  Medication Sig   b complex vitamins tablet Take 1 tablet by mouth daily.   FA-B6-B12-D-Omega 3-Phytoster (ANIMI-3/VITAMIN D PO) Take 5,000 Int'l Units/1.58m by mouth.   MILK THISTLE PO Take by mouth daily.   Multiple Vitamins-Minerals (MULTIVITAMIN ADULT PO) Take 1 tablet by mouth daily.   [DISCONTINUED] OVER THE COUNTER MEDICATION daily at 8 pm. BEET JUICE capsule   [DISCONTINUED] OVER THE COUNTER MEDICATION daily at 8 pm. MEGAFOOD BLOOD BUILDER   No facility-administered medications prior to visit.   Review of Systems  Constitutional:  Negative for chills, fever, malaise/fatigue and weight loss.  HENT:  Negative for congestion, ear pain, hearing loss, sinus pain and sore throat.   Eyes:  Negative for blurred vision, photophobia and pain.  Respiratory:  Negative for cough, shortness of breath  and wheezing.   Cardiovascular:  Negative for chest pain, palpitations and leg swelling.  Gastrointestinal:  Negative for abdominal pain, constipation, diarrhea, heartburn, nausea and vomiting.  Genitourinary:  Negative for dysuria, frequency and urgency.  Musculoskeletal:  Negative for falls and neck pain.  Skin:  Negative for itching and rash.  Neurological:  Negative for dizziness, weakness and headaches.  Endo/Heme/Allergies:  Negative for polydipsia. Does not bruise/bleed easily.  Psychiatric/Behavioral:  Negative for depression, substance abuse and suicidal ideas. The patient is not nervous/anxious and does not have insomnia.      Objective:     BP (!) 140/102 (BP Location: Left Arm, Cuff Size: Large)   Pulse 86   Resp 20   Ht 5' 4"$  (1.626 m)   Wt 232 lb 9.6 oz (105.5 kg)   SpO2 100%   BMI 39.93 kg/m    Physical Exam Constitutional:      General: She is not in acute distress.    Appearance: Normal appearance. She is not ill-appearing.  HENT:     Head: Normocephalic and atraumatic.     Right Ear: Tympanic membrane, ear canal and external ear normal. There is no impacted cerumen.     Left Ear: Tympanic membrane, ear canal and external ear normal. There is no impacted cerumen.     Nose: Nose normal. No congestion or rhinorrhea.  Mouth/Throat:     Mouth: Mucous membranes are moist.     Pharynx: No oropharyngeal exudate or posterior oropharyngeal erythema.  Eyes:     General: No scleral icterus.       Right eye: No discharge.        Left eye: No discharge.     Extraocular Movements: Extraocular movements intact.     Conjunctiva/sclera: Conjunctivae normal.     Pupils: Pupils are equal, round, and reactive to light.  Neck:     Thyroid: No thyromegaly.     Vascular: No carotid bruit or JVD.     Trachea: Trachea normal.  Cardiovascular:     Rate and Rhythm: Normal rate and regular rhythm.     Pulses: Normal pulses.     Heart sounds: Normal heart sounds. No murmur  heard.    No friction rub. No gallop.  Pulmonary:     Effort: Pulmonary effort is normal. No respiratory distress.     Breath sounds: Normal breath sounds. No wheezing.  Abdominal:     General: Bowel sounds are normal. There is no distension.     Palpations: Abdomen is soft.     Tenderness: There is no abdominal tenderness. There is no guarding.  Musculoskeletal:        General: Normal range of motion.     Cervical back: Normal range of motion and neck supple.  Lymphadenopathy:     Cervical: No cervical adenopathy.  Skin:    General: Skin is warm and dry.  Neurological:     Mental Status: She is alert and oriented to person, place, and time.     Cranial Nerves: No cranial nerve deficit.  Psychiatric:        Mood and Affect: Mood normal.        Behavior: Behavior normal.        Thought Content: Thought content normal.        Judgment: Judgment normal.   No results found for any visits on 05/11/22.     Assessment & Plan:    Routine Health Maintenance and Physical Exam  Immunization History  Administered Date(s) Administered   Hep A / Hep B 08/02/2011, 11/12/2012, 06/25/2013   Td 12/21/2005   Tdap 04/16/2016    Health Maintenance  Topic Date Due   HIV Screening  Never done   PAP SMEAR-Modifier  07/31/2020   Zoster Vaccines- Shingrix (1 of 2) Never done   INFLUENZA VACCINE  07/01/2022 (Originally 10/31/2021)   MAMMOGRAM  12/02/2023   DTaP/Tdap/Td (3 - Td or Tdap) 04/16/2026   COLONOSCOPY (Pts 45-73yr Insurance coverage will need to be confirmed)  04/07/2031   Hepatitis C Screening  Completed   HPV VACCINES  Aged Out   COVID-19 Vaccine  Discontinued    Discussed health benefits of physical activity, and encouraged her to engage in regular exercise appropriate for her age and condition.  1. Annual physical exam Checking labs as below.  Up-to-date on preventative care.  Wellness information provided with AVS. - CBC with Differential/Platelet - COMPLETE METABOLIC  PANEL WITH GFR - Lipid panel  2. Iron deficiency anemia due to chronic blood loss Checking iron panel today. - Iron, TIBC and Ferritin Panel  3. Lipid screening Checking lipid panel. - Lipid panel  4. Morbid obesity (HCC) Checking hemoglobin A1c.  Advised to contact her insurance company to see if any antiobesity medications are covered under her policy.  If so, advised her to reach out and let me know if there  is something she is interested in trying. - Hemoglobin A1c  5. Prehypertension Blood pressure elevated on arrival at 140/102.  Recheck continues to be elevated.  Discussed limiting salt intake and starting regular intentional exercise.  Strongly recommend weight loss to healthy weight.  Advised to monitor blood pressure at home and if consistently higher than 130/80, we will need to discuss possibly starting medication.  Patient would like to try lifestyle modifications and weight loss efforts over the next 3 to 6 months before committing to starting a medication.  Return in about 6 months (around 11/09/2022) for chronic disease follow up.   Samuel Bouche, NP

## 2022-05-12 LAB — COMPLETE METABOLIC PANEL WITH GFR
AG Ratio: 1.3 (calc) (ref 1.0–2.5)
ALT: 12 U/L (ref 6–29)
AST: 16 U/L (ref 10–35)
Albumin: 4.2 g/dL (ref 3.6–5.1)
Alkaline phosphatase (APISO): 75 U/L (ref 37–153)
BUN: 9 mg/dL (ref 7–25)
CO2: 25 mmol/L (ref 20–32)
Calcium: 9.5 mg/dL (ref 8.6–10.4)
Chloride: 104 mmol/L (ref 98–110)
Creat: 0.82 mg/dL (ref 0.50–1.03)
Globulin: 3.3 g/dL (calc) (ref 1.9–3.7)
Glucose, Bld: 87 mg/dL (ref 65–99)
Potassium: 4 mmol/L (ref 3.5–5.3)
Sodium: 140 mmol/L (ref 135–146)
Total Bilirubin: 0.5 mg/dL (ref 0.2–1.2)
Total Protein: 7.5 g/dL (ref 6.1–8.1)
eGFR: 87 mL/min/{1.73_m2} (ref >=60)

## 2022-05-12 LAB — CBC WITH DIFFERENTIAL/PLATELET
Absolute Monocytes: 661 cells/uL (ref 200–950)
Basophils Absolute: 17 cells/uL (ref 0–200)
Basophils Relative: 0.3 %
Eosinophils Absolute: 17 cells/uL (ref 15–500)
Eosinophils Relative: 0.3 %
HCT: 43 % (ref 35.0–45.0)
Hemoglobin: 13.7 g/dL (ref 11.7–15.5)
Lymphs Abs: 2297 cells/uL (ref 850–3900)
MCH: 23.6 pg — ABNORMAL LOW (ref 27.0–33.0)
MCHC: 31.9 g/dL — ABNORMAL LOW (ref 32.0–36.0)
MCV: 74 fL — ABNORMAL LOW (ref 80.0–100.0)
MPV: 10.6 fL (ref 7.5–12.5)
Monocytes Relative: 11.4 %
Neutro Abs: 2807 cells/uL (ref 1500–7800)
Neutrophils Relative %: 48.4 %
Platelets: 290 10*3/uL (ref 140–400)
RBC: 5.81 10*6/uL — ABNORMAL HIGH (ref 3.80–5.10)
RDW: 14.5 % (ref 11.0–15.0)
Total Lymphocyte: 39.6 %
WBC: 5.8 10*3/uL (ref 3.8–10.8)

## 2022-05-12 LAB — LIPID PANEL
Cholesterol: 203 mg/dL — ABNORMAL HIGH (ref <200)
HDL: 34 mg/dL — ABNORMAL LOW (ref >=50)
LDL Cholesterol (Calc): 146 mg/dL (calc) — ABNORMAL HIGH
Non-HDL Cholesterol (Calc): 169 mg/dL (calc) — ABNORMAL HIGH (ref <130)
Total CHOL/HDL Ratio: 6 (calc) — ABNORMAL HIGH (ref <5.0)
Triglycerides: 115 mg/dL (ref <150)

## 2022-05-12 LAB — IRON,TIBC AND FERRITIN PANEL
%SAT: 21 % (ref 16–45)
Ferritin: 90 ng/mL (ref 16–232)
Iron: 53 ug/dL (ref 45–160)
TIBC: 255 ug/dL (ref 250–450)

## 2022-05-12 LAB — HEMOGLOBIN A1C
Hgb A1c MFr Bld: 6.1 %{Hb} — ABNORMAL HIGH
Mean Plasma Glucose: 128 mg/dL
eAG (mmol/L): 7.1 mmol/L

## 2022-08-16 DIAGNOSIS — Z90710 Acquired absence of both cervix and uterus: Secondary | ICD-10-CM | POA: Insufficient documentation

## 2022-09-27 LAB — LAB REPORT - SCANNED: EGFR: 79

## 2022-09-28 ENCOUNTER — Ambulatory Visit: Payer: 59 | Admitting: Medical-Surgical

## 2022-09-28 ENCOUNTER — Ambulatory Visit (INDEPENDENT_AMBULATORY_CARE_PROVIDER_SITE_OTHER): Payer: 59

## 2022-09-28 ENCOUNTER — Encounter: Payer: Self-pay | Admitting: Medical-Surgical

## 2022-09-28 VITALS — BP 126/84 | HR 101 | Resp 20 | Ht 64.0 in | Wt 227.6 lb

## 2022-09-28 DIAGNOSIS — R42 Dizziness and giddiness: Secondary | ICD-10-CM | POA: Diagnosis not present

## 2022-09-28 DIAGNOSIS — R519 Headache, unspecified: Secondary | ICD-10-CM

## 2022-09-28 DIAGNOSIS — R202 Paresthesia of skin: Secondary | ICD-10-CM

## 2022-09-28 DIAGNOSIS — D500 Iron deficiency anemia secondary to blood loss (chronic): Secondary | ICD-10-CM | POA: Diagnosis not present

## 2022-09-28 DIAGNOSIS — Z87898 Personal history of other specified conditions: Secondary | ICD-10-CM

## 2022-09-28 NOTE — Progress Notes (Signed)
        Established patient visit  History, exam, impression, and plan:  1. Nonintractable headache, unspecified chronicity pattern, unspecified headache type 2. Dizziness Pleasant 52 year old female presenting today to discuss recent lab results and symptoms that she has been experiencing.  She had a hysterectomy approximately 6 weeks ago and did well with her surgery overall.  Immediately after her surgery, she developed issues with frequent headaches which has never been a problem before.  She also has been experiencing frequent issues with lightheadedness/dizziness on a daily basis.  Initially thought it was related to postsurgical analgesia however her symptoms have continued to persist.  She discussed this with her OB/GYN yesterday and they did a CBC and CMP.  She did have a couple of abnormalities but these were very mild.  No specific indication of what may be causing her symptoms.  She was released back to full duty yesterday by OB/GYN but feels that she is not able to return to work given the symptoms.  Dizziness occurs mostly when driving although this does happen with increased activity levels.  Reports this feels more as a "wobbly feeling or disconnected" rather than a spinning sensation.  Her headaches occur approximately 4-5 times per week in the mornings but sometimes throughout the day.  These morning headaches last 2 to 3 hours and respond somewhat to ibuprofen.  She prefers not to have to take a medication and will hold off unless it is severe.  The headaches are described as in the right side of the head above the ear and feel like a pulsation.  No neurological deficits.  Plan check labs as below.  She does have a history of iron deficiency anemia and her labs done yesterday were notable for low MCV/MCHC.  Offered meclizine for the dizziness however she would like to hold off and see what the labs say.  Okay to use ibuprofen as needed however would recommend prevent doing overuse as this  can cause rebound headaches.  With her dizziness with driving and increased activity, extending her time away from work for the next week. - TSH - Hemoglobin A1c - VITAMIN D 25 Hydroxy (Vit-D Deficiency, Fractures) - Vitamin B12 - Iron, TIBC and Ferritin Panel  3. Paresthesia of both hands Since her surgery, has noted that her hands/fingers are numb and tingling in the morning.  No known neck issues, neck pain, or trauma.  On exam, skin warm without color changes.  Radial pulses 2+ bilaterally.  Grips strong.  Negative Phalen's and Tinel's.  Checking labs as below to evaluate for vitamin deficiency that may be contributing.  Suspect cervical spine etiology given the bilateral nature and negative exam.  Getting cervical spine x-rays today. - TSH - VITAMIN D 25 Hydroxy (Vit-D Deficiency, Fractures) - Vitamin B12 - DG Cervical Spine Complete; Future  4. History of prediabetes Checking hemoglobin A1c. - Hemoglobin A1c  5. Iron deficiency anemia due to chronic blood loss Checking iron. - Iron, TIBC and Ferritin Panel  Procedures performed this visit: None.  Return if symptoms worsen or fail to improve.  __________________________________ Thayer Ohm, DNP, APRN, FNP-BC Primary Care and Sports Medicine [facility] [place]

## 2022-09-29 LAB — TSH: TSH: 1.19 mIU/L

## 2022-09-29 LAB — IRON,TIBC AND FERRITIN PANEL
%SAT: 27 % (ref 16–45)
Ferritin: 83 ng/mL (ref 16–232)
Iron: 74 ug/dL (ref 45–160)
TIBC: 273 ug/dL (ref 250–450)

## 2022-09-29 LAB — VITAMIN B12: Vitamin B-12: 1960 pg/mL — ABNORMAL HIGH (ref 200–1100)

## 2022-09-29 LAB — VITAMIN D 25 HYDROXY (VIT D DEFICIENCY, FRACTURES): Vit D, 25-Hydroxy: 31 ng/mL (ref 30–100)

## 2022-09-29 LAB — HEMOGLOBIN A1C
Hgb A1c MFr Bld: 6.1 %{Hb} — ABNORMAL HIGH (ref <5.7)
Mean Plasma Glucose: 128 mg/dL
eAG (mmol/L): 7.1 mmol/L

## 2022-10-01 ENCOUNTER — Telehealth: Payer: Self-pay | Admitting: Medical-Surgical

## 2022-10-01 NOTE — Telephone Encounter (Signed)
Patient called stating is was ok for Christen Butter to prescribe her prefer medication for dizziness.

## 2022-10-03 ENCOUNTER — Other Ambulatory Visit: Payer: Self-pay | Admitting: Medical-Surgical

## 2022-10-03 MED ORDER — MECLIZINE HCL 25 MG PO TABS: 25.0000 mg | ORAL_TABLET | Freq: Three times a day (TID) | ORAL | 0 refills | Status: DC | PRN

## 2022-10-03 NOTE — Progress Notes (Signed)
Meclizine 25mg  TID prn for dizziness sent to the pharmacy on file.  ___________________________________________ Thayer Ohm, DNP, APRN, FNP-BC Primary Care and Sports Medicine [facility] [place]

## 2022-10-03 NOTE — Progress Notes (Signed)
Patient informed. 

## 2022-10-05 NOTE — Telephone Encounter (Signed)
Patient informed medication was sent to the pharmacy. Patient picked up prescription.

## 2022-10-12 ENCOUNTER — Encounter: Payer: Self-pay | Admitting: Medical-Surgical

## 2022-11-09 ENCOUNTER — Ambulatory Visit: Payer: 59 | Admitting: Medical-Surgical

## 2022-12-04 LAB — HM MAMMOGRAPHY

## 2022-12-12 ENCOUNTER — Encounter: Payer: Self-pay | Admitting: Medical-Surgical

## 2023-06-25 ENCOUNTER — Encounter: Payer: Self-pay | Admitting: Hematology and Oncology

## 2023-07-02 ENCOUNTER — Ambulatory Visit (INDEPENDENT_AMBULATORY_CARE_PROVIDER_SITE_OTHER): Admitting: Medical-Surgical

## 2023-07-02 ENCOUNTER — Encounter: Payer: Self-pay | Admitting: Medical-Surgical

## 2023-07-02 ENCOUNTER — Ambulatory Visit

## 2023-07-02 ENCOUNTER — Encounter: Payer: Self-pay | Admitting: Hematology and Oncology

## 2023-07-02 VITALS — BP 136/88 | HR 83 | Resp 20 | Ht 64.0 in | Wt 240.5 lb

## 2023-07-02 DIAGNOSIS — Z0000 Encounter for general adult medical examination without abnormal findings: Secondary | ICD-10-CM | POA: Diagnosis not present

## 2023-07-02 DIAGNOSIS — M545 Low back pain, unspecified: Secondary | ICD-10-CM

## 2023-07-02 DIAGNOSIS — G8929 Other chronic pain: Secondary | ICD-10-CM

## 2023-07-02 DIAGNOSIS — R600 Localized edema: Secondary | ICD-10-CM

## 2023-07-02 DIAGNOSIS — Z8639 Personal history of other endocrine, nutritional and metabolic disease: Secondary | ICD-10-CM

## 2023-07-02 NOTE — Progress Notes (Signed)
 Complete physical exam  Patient: Michele Roman   DOB: 07/20/1970   53 y.o. Female  MRN: [id number]  Subjective:    Chief Complaint  Patient presents with   Annual Exam    Michele Roman is a 53 y.o. female who presents today for a complete physical exam. She reports consuming a general diet.  Walking 2 miles three times weekly.  She generally feels well. She reports sleeping well. She does have additional problems to discuss today.    Most recent fall risk assessment:    05/11/2022   10:18 AM  Fall Risk   Falls in the past year? 0  Number falls in past yr: 0  Injury with Fall? 0  Risk for fall due to : No Fall Risks  Follow up Falls evaluation completed     Most recent depression screenings:    07/02/2023    9:35 AM 05/11/2022   10:25 AM  PHQ 2/9 Scores  PHQ - 2 Score 0 0    Vision:Within last year and Dental: No current dental problems and Receives regular dental care    Patient Care Team: Christen Butter, NP as PCP - General (Nurse Practitioner) Ailene Ards, MD as Referring Physician (Obstetrics and Gynecology)   Outpatient Medications Prior to Visit  Medication Sig   HAWTHORNE BERRY PO Take by mouth.   MAGNESIUM PO Take by mouth.   Multiple Vitamin (MULTIVITAMIN ADULT PO) Take by mouth.   [DISCONTINUED] meclizine (ANTIVERT) 25 MG tablet Take 1 tablet (25 mg total) by mouth 3 (three) times daily as needed for dizziness.   No facility-administered medications prior to visit.    Review of Systems  Constitutional:  Negative for chills, fever, malaise/fatigue and weight loss.  HENT:  Negative for congestion, ear pain, hearing loss, sinus pain and sore throat.   Eyes:  Negative for blurred vision, photophobia and pain.  Respiratory:  Negative for cough, shortness of breath and wheezing.   Cardiovascular:  Positive for leg swelling. Negative for chest pain and palpitations.  Gastrointestinal:  Negative for abdominal pain, blood in stool, constipation, diarrhea,  heartburn, melena, nausea and vomiting.  Genitourinary:  Negative for dysuria, frequency and urgency.  Musculoskeletal:  Positive for back pain. Negative for falls and neck pain.  Skin:  Negative for itching and rash.  Neurological:  Negative for dizziness, weakness and headaches.  Endo/Heme/Allergies:  Negative for environmental allergies and polydipsia. Does not bruise/bleed easily.  Psychiatric/Behavioral:  Negative for depression, substance abuse and suicidal ideas. The patient is not nervous/anxious and does not have insomnia.      Objective:     BP 136/88 (BP Location: Left Arm, Cuff Size: Normal)   Pulse 83   Resp 20   Ht 5\' 4"  (1.626 m)   Wt 240 lb 8 oz (109.1 kg)   SpO2 97%   BMI 41.28 kg/m    Physical Exam Vitals reviewed.  Constitutional:      General: She is not in acute distress.    Appearance: Normal appearance. She is obese. She is not ill-appearing.  HENT:     Head: Normocephalic and atraumatic.     Right Ear: Tympanic membrane, ear canal and external ear normal. There is no impacted cerumen.     Left Ear: Tympanic membrane, ear canal and external ear normal. There is no impacted cerumen.     Nose: Nose normal. No congestion or rhinorrhea.     Mouth/Throat:     Mouth: Mucous membranes are moist.  Pharynx: No oropharyngeal exudate or posterior oropharyngeal erythema.  Eyes:     General: No scleral icterus.       Right eye: No discharge.        Left eye: No discharge.     Extraocular Movements: Extraocular movements intact.     Conjunctiva/sclera: Conjunctivae normal.     Pupils: Pupils are equal, round, and reactive to light.  Neck:     Thyroid: No thyromegaly.     Vascular: No carotid bruit or JVD.     Trachea: Trachea normal.  Cardiovascular:     Rate and Rhythm: Normal rate and regular rhythm.     Pulses: Normal pulses.     Heart sounds: Normal heart sounds. No murmur heard.    No friction rub. No gallop.  Pulmonary:     Effort: Pulmonary effort  is normal. No respiratory distress.     Breath sounds: Normal breath sounds. No wheezing.  Abdominal:     General: Bowel sounds are normal. There is no distension.     Palpations: Abdomen is soft.     Tenderness: There is no abdominal tenderness. There is no guarding.  Musculoskeletal:        General: Normal range of motion.     Cervical back: Normal range of motion and neck supple.     Lumbar back: Bony tenderness present.     Right lower leg: Edema present.     Left lower leg: Edema present.     Comments: Midline lumbar spine tenderness with divot in skin and overlying discoloration similar to spider veins.  Lymphadenopathy:     Cervical: No cervical adenopathy.  Skin:    General: Skin is warm and dry.  Neurological:     Mental Status: She is alert and oriented to person, place, and time.     Cranial Nerves: No cranial nerve deficit.  Psychiatric:        Mood and Affect: Mood normal.        Behavior: Behavior normal.        Thought Content: Thought content normal.        Judgment: Judgment normal.   No results found for any visits on 07/02/23.     Assessment & Plan:    Routine Health Maintenance and Physical Exam  Immunization History  Administered Date(s) Administered   Hep A / Hep B 08/02/2011, 11/12/2012, 06/25/2013   Td 12/21/2005   Tdap 04/16/2016    Health Maintenance  Topic Date Due   Zoster Vaccines- Shingrix (1 of 2) Never done   HIV Screening  07/01/2024 (Originally 11/17/1986)   INFLUENZA VACCINE  11/01/2023   MAMMOGRAM  12/03/2024   DTaP/Tdap/Td (3 - Td or Tdap) 04/16/2026   Colonoscopy  04/07/2031   Hepatitis C Screening  Completed   HPV VACCINES  Aged Out   COVID-19 Vaccine  Discontinued    Discussed health benefits of physical activity, and encouraged her to engage in regular exercise appropriate for her age and condition.  1. Annual physical exam (Primary) Checking labs as below. UTD on preventative care. Wellness information provided with  AVS. - CBC with Differential/Platelet - CMP14+EGFR  2. Morbid obesity (HCC) Discussed recommendation for weight loss, exercise, and dietary modifications. Checking labs as below.  - TSH - Lipid panel - Hemoglobin A1c  3. Chronic midline low back pain without sciatica Unclear etiology. Getting x-rays today. Has Ibuprofen at home. Discussed a muscle relaxer but she wants to hold off for now. Plan for physical therapy  referral once results are back. 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  - DG Lumbar Spine Complete; Future  4. Bilateral lower extremity edema Unclear etiology. No pitting edema but patient feels swollen and her pants are tighter than usual. Checking labs today. Getting chest x-ray to eval for cardiomegaly and pleural effusion.  - TSH - B Nat Peptide - DG Chest 2 View; Future  5. History of iron deficiency Checking iron today.  - Iron, TIBC and Ferritin Panel  6. History of vitamin D deficiency Checking vitamin D.  - VITAMIN D 25 Hydroxy (Vit-D Deficiency, Fractures)   Return in about 1 year (around 07/01/2024) for annual physical exam, sooner if needed.     Christen Butter, NP

## 2023-07-02 NOTE — Patient Instructions (Signed)
 Preventive Care 16-52 Years Old, Female  Preventive care refers to lifestyle choices and visits with your health care provider that can promote health and wellness. Preventive care visits are also called wellness exams.  What can I expect for my preventive care visit?  Counseling  Your health care provider may ask you questions about your:  Medical history, including:  Past medical problems.  Family medical history.  Pregnancy history.  Current health, including:  Menstrual cycle.  Method of birth control.  Emotional well-being.  Home life and relationship well-being.  Sexual activity and sexual health.  Lifestyle, including:  Alcohol, nicotine or tobacco, and drug use.  Access to firearms.  Diet, exercise, and sleep habits.  Work and work Astronomer.  Sunscreen use.  Safety issues such as seatbelt and bike helmet use.  Physical exam  Your health care provider will check your:  Height and weight. These may be used to calculate your BMI (body mass index). BMI is a measurement that tells if you are at a healthy weight.  Waist circumference. This measures the distance around your waistline. This measurement also tells if you are at a healthy weight and may help predict your risk of certain diseases, such as type 2 diabetes and high blood pressure.  Heart rate and blood pressure.  Body temperature.  Skin for abnormal spots.  What immunizations do I need?    Vaccines are usually given at various ages, according to a schedule. Your health care provider will recommend vaccines for you based on your age, medical history, and lifestyle or other factors, such as travel or where you work.  What tests do I need?  Screening  Your health care provider may recommend screening tests for certain conditions. This may include:  Lipid and cholesterol levels.  Diabetes screening. This is done by checking your blood sugar (glucose) after you have not eaten for a while (fasting).  Pelvic exam and Pap test.  Hepatitis B test.  Hepatitis C  test.  HIV (human immunodeficiency virus) test.  STI (sexually transmitted infection) testing, if you are at risk.  Lung cancer screening.  Colorectal cancer screening.  Mammogram. Talk with your health care provider about when you should start having regular mammograms. This may depend on whether you have a family history of breast cancer.  BRCA-related cancer screening. This may be done if you have a family history of breast, ovarian, tubal, or peritoneal cancers.  Bone density scan. This is done to screen for osteoporosis.  Talk with your health care provider about your test results, treatment options, and if necessary, the need for more tests.  Follow these instructions at home:  Eating and drinking    Eat a diet that includes fresh fruits and vegetables, whole grains, lean protein, and low-fat dairy products.  Take vitamin and mineral supplements as recommended by your health care provider.  Do not drink alcohol if:  Your health care provider tells you not to drink.  You are pregnant, may be pregnant, or are planning to become pregnant.  If you drink alcohol:  Limit how much you have to 0-1 drink a day.  Know how much alcohol is in your drink. In the [place] one drink equals one 12 oz bottle of beer (355 mL), one 5 oz glass of wine (148 mL), or one 1 oz glass of hard liquor (44 mL).  Lifestyle  Brush your teeth every morning and night with fluoride toothpaste. Floss one time each day.  Exercise for at least  30 minutes 5 or more days each week.  Do not use any products that contain nicotine or tobacco. These products include cigarettes, chewing tobacco, and vaping devices, such as e-cigarettes. If you need help quitting, ask your health care provider.  Do not use drugs.  If you are sexually active, practice safe sex. Use a condom or other form of protection to prevent STIs.  If you do not wish to become pregnant, use a form of birth control. If you plan to become pregnant, see your health care provider for a  prepregnancy visit.  Take aspirin only as told by your health care provider. Make sure that you understand how much to take and what form to take. Work with your health care provider to find out whether it is safe and beneficial for you to take aspirin daily.  Find healthy ways to manage stress, such as:  Meditation, yoga, or listening to music.  Journaling.  Talking to a trusted person.  Spending time with friends and family.  Minimize exposure to UV radiation to reduce your risk of skin cancer.  Safety  Always wear your seat belt while driving or riding in a vehicle.  Do not drive:  If you have been drinking alcohol. Do not ride with someone who has been drinking.  When you are tired or distracted.  While texting.  If you have been using any mind-altering substances or drugs.  Wear a helmet and other protective equipment during sports activities.  If you have firearms in your house, make sure you follow all gun safety procedures.  Seek help if you have been physically or sexually abused.  What's next?  Visit your health care provider once a year for an annual wellness visit.  Ask your health care provider how often you should have your eyes and teeth checked.  Stay up to date on all vaccines.  This information is not intended to replace advice given to you by your health care provider. Make sure you discuss any questions you have with your health care provider.  Document Revised: 09/14/2020 Document Reviewed: 09/14/2020  Elsevier Patient Education  2024 [employer].

## 2023-07-03 ENCOUNTER — Encounter: Payer: Self-pay | Admitting: Medical-Surgical

## 2023-07-03 LAB — VITAMIN D 25 HYDROXY (VIT D DEFICIENCY, FRACTURES): Vit D, 25-Hydroxy: 30 ng/mL (ref 30.0–100.0)

## 2023-07-03 LAB — IRON,TIBC AND FERRITIN PANEL
Ferritin: 91 ng/mL (ref 15–150)
Iron Saturation: 31 % (ref 15–55)
Iron: 77 ug/dL (ref 27–159)
Total Iron Binding Capacity: 245 ug/dL — ABNORMAL LOW (ref 250–450)
UIBC: 168 ug/dL (ref 131–425)

## 2023-07-04 LAB — CBC WITH DIFFERENTIAL/PLATELET
Basophils Absolute: 0.1 10*3/uL (ref 0.0–0.2)
Basos: 1 %
EOS (ABSOLUTE): 0 10*3/uL (ref 0.0–0.4)
Eos: 0 %
Hematocrit: 46 % (ref 34.0–46.6)
Hemoglobin: 14.3 g/dL (ref 11.1–15.9)
Immature Grans (Abs): 0 10*3/uL (ref 0.0–0.1)
Immature Granulocytes: 0 %
Lymphocytes Absolute: 2.9 10*3/uL (ref 0.7–3.1)
Lymphs: 43 %
MCH: 23.5 pg — ABNORMAL LOW (ref 26.6–33.0)
MCHC: 31.1 g/dL — ABNORMAL LOW (ref 31.5–35.7)
MCV: 76 fL — ABNORMAL LOW (ref 79–97)
Monocytes Absolute: 0.4 10*3/uL (ref 0.1–0.9)
Monocytes: 6 %
Neutrophils Absolute: 3.3 10*3/uL (ref 1.4–7.0)
Neutrophils: 50 %
Platelets: 299 10*3/uL (ref 150–450)
RBC: 6.08 x10E6/uL — ABNORMAL HIGH (ref 3.77–5.28)
RDW: 15.9 % — ABNORMAL HIGH (ref 11.7–15.4)
WBC: 6.8 10*3/uL (ref 3.4–10.8)

## 2023-07-04 LAB — CMP14+EGFR
ALT: 9 IU/L (ref 0–32)
AST: 20 IU/L (ref 0–40)
Albumin: 4 g/dL (ref 3.8–4.9)
Alkaline Phosphatase: 55 IU/L (ref 44–121)
BUN/Creatinine Ratio: 12 (ref 9–23)
BUN: 9 mg/dL (ref 6–24)
Bilirubin Total: 0.4 mg/dL (ref 0.0–1.2)
CO2: 20 mmol/L (ref 20–29)
Calcium: 9 mg/dL (ref 8.7–10.2)
Chloride: 107 mmol/L — ABNORMAL HIGH (ref 96–106)
Creatinine, Ser: 0.73 mg/dL (ref 0.57–1.00)
Globulin, Total: 2.5 g/dL (ref 1.5–4.5)
Glucose: 91 mg/dL (ref 70–99)
Potassium: 4.1 mmol/L (ref 3.5–5.2)
Sodium: 142 mmol/L (ref 134–144)
Total Protein: 6.5 g/dL (ref 6.0–8.5)
eGFR: 100 mL/min/{1.73_m2} (ref >59)

## 2023-07-04 LAB — BRAIN NATRIURETIC PEPTIDE: BNP: 41.7 pg/mL (ref 0.0–100.0)

## 2023-07-04 LAB — LIPID PANEL
Chol/HDL Ratio: 5.8 ratio — ABNORMAL HIGH (ref 0.0–4.4)
Cholesterol, Total: 196 mg/dL (ref 100–199)
HDL: 34 mg/dL — ABNORMAL LOW (ref >39)
LDL Chol Calc (NIH): 134 mg/dL — ABNORMAL HIGH (ref 0–99)
Triglycerides: 154 mg/dL — ABNORMAL HIGH (ref 0–149)
VLDL Cholesterol Cal: 28 mg/dL (ref 5–40)

## 2023-07-04 LAB — TSH: TSH: 1.1 u[IU]/mL (ref 0.450–4.500)

## 2023-07-04 LAB — HEMOGLOBIN A1C
Est. average glucose Bld gHb Est-mCnc: 117 mg/dL
Hgb A1c MFr Bld: 5.7 % — ABNORMAL HIGH (ref 4.8–5.6)

## 2023-07-05 ENCOUNTER — Encounter: Payer: Self-pay | Admitting: Medical-Surgical

## 2023-07-05 DIAGNOSIS — G8929 Other chronic pain: Secondary | ICD-10-CM

## 2023-07-08 ENCOUNTER — Encounter: Payer: Self-pay | Admitting: Medical-Surgical

## 2023-07-08 MED ORDER — MELOXICAM 15 MG PO TABS: 15.0000 mg | ORAL_TABLET | Freq: Every day | ORAL | 0 refills | Status: AC

## 2023-07-08 MED ORDER — CYCLOBENZAPRINE HCL 10 MG PO TABS
5.0000 mg | ORAL_TABLET | Freq: Three times a day (TID) | ORAL | 1 refills | Status: AC | PRN
Start: 2023-07-08 — End: ?

## 2023-07-18 NOTE — Therapy (Signed)
 OUTPATIENT PHYSICAL THERAPY THORACOLUMBAR EVALUATION   Patient Name: Michele Roman MRN: [id number] DOB:October 13, 1970, 53 y.o., female Today's Date: 07/23/2023  END OF SESSION:  PT End of Session - 07/23/23 1334     Visit Number 1    Number of Visits 16    Date for PT Re-Evaluation 09/17/23    Authorization Type aetna copay none    Authorization Time Period year    Authorization - Visit Number 1    Authorization - Number of Visits 40    PT Start Time 1145    PT Stop Time 1238    PT Time Calculation (min) 53 min    Activity Tolerance Patient tolerated treatment well             Past Medical History:  Diagnosis Date   Anemia    Endometrial polyp 02/22/2020   Family history of colon cancer 06/10/2018   Father age 59   H/O colonoscopy 03/01/2009   Menorrhagia with regular cycle 02/22/2020   Treated with endometrial ablation January 2022   Past Surgical History:  Procedure Laterality Date   APPENDECTOMY  1997   CERVICAL POLYPECTOMY  04/2020   CESAREAN SECTION  1999   COLONOSCOPY  2012   normal per pt   DILATION AND CURETTAGE, DIAGNOSTIC / THERAPEUTIC  04/2020   EXPLORATORY LAPAROTOMY  1999   Right foot surgery  2008, 2011   hardware in the foot   UTERINE ABLATION  04/2020   Patient Active Problem List   Diagnosis Date Noted   S/P laparoscopic hysterectomy 08/16/2022   Iron  deficiency anemia due to chronic blood loss 03/04/2020   Morbid obesity (HCC) 02/22/2020   Family history of colon cancer 06/10/2018   Anemia, iron  deficiency 06/25/2013   Fatty liver 04/15/2009    PCP: Cherre Cornish, NP  REFERRING PROVIDER: Cherre Cornish, NP  REFERRING DIAG: Chronic midline LBP  Rationale for Evaluation and Treatment: Rehabilitation  THERAPY DIAG:  Other low back pain  Other symptoms and signs involving the musculoskeletal system  Muscle weakness (generalized)  ONSET DATE: 06/15/23  SUBJECTIVE:                                                                                                                                                                                            SUBJECTIVE STATEMENT: Patient reports that she noticed LBP the day after hysterectomy last year. She treated pain with heat which was intermittent. She noticed pain and spider like appearance in the R LB area over the past 5 weeks or so. She had xrays which showed some degenerative changes. Feels the symptoms are associated with sitting  in chair at work. She was on vacation for the past two weeks and had no discomfort. She returned to work yesterday and pain started again and is worse today. 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 Yes: NPRS scale: 6/10 Pain location: center of LB Pain description: knot Aggravating factors: sitting at work  Relieving factors: not working and she is OK sitting at home; recliner   PRECAUTIONS: None  RED FLAGS: None   WEIGHT BEARING RESTRICTIONS: No  FALLS:  Has patient fallen in last 6 months? 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  DIAGNOSTIC FINDINGS:  Xray - 07/02/23: Leftward curvature lumbar spine. Preservation of the vertebral body heights. Degenerative disc disease most pronounced L1-2 and L5-S1. Stool throughout the colon. SI joints are unremarkable.    IMPRESSION: Degenerative disc disease most pronounced L1-2 and L5-S1.  PATIENT SURVEYS:  Oswestry 11/50; 22%    COGNITION: Overall cognitive status: Within functional limits for tasks assessed     SENSATION: WFL  MUSCLE LENGTH: Hamstrings: Right 75 deg; Left 75 deg Thomas test: Right -10-20 deg; Left -10-20 deg  POSTURE: rounded shoulders, forward head, flexed trunk , and LE's in ER   PALPATION: Tightness bilat hip flexors L > R; lumbar paraspinals R > L  Pain with PA mobs sacrum; lower lumbar    LUMBAR ROM:   AROM eval  Flexion 30% pain   Extension 20% pain   Right lateral flexion 60% pull L  Left lateral flexion 55% pull R   Right rotation 20%   Left rotation 20%   (Blank rows = not tested)  LOWER EXTREMITY ROM:     Active  Right eval Left eval  Hip flexion Tight  Tight   Hip extension Tight  Tight   Hip abduction    Hip adduction    Hip internal rotation Tight   Hip external rotation    Knee flexion    Knee extension    Ankle dorsiflexion    Ankle plantarflexion    Ankle inversion    Ankle eversion     (Blank rows = not tested)  LOWER EXTREMITY MMT:    MMT Right eval Left eval  Hip flexion 4- 4  Hip extension 4- 4  Hip abduction 4- 4  Hip adduction    Hip internal rotation    Hip external rotation    Knee flexion 5 5  Knee extension 5 5  Ankle dorsiflexion    Ankle plantarflexion    Ankle inversion    Ankle eversion     (Blank rows = not tested)  LUMBAR SPECIAL TESTS:  Straight leg raise test: Negative and Slump test: Negative  FUNCTIONAL TESTS:  5 times sit to stand: 20.81 use of UE's "tension in the center of LB'  GAIT: Distance walked: 40 feet Assistive device  utilized: None Level of assistance: Complete Independence Comments: LE's in ER; trunk flexed forward at hips  TREATMENT DATE: 07/23/23 Review of evaluation findings POC HEP as noted below                                                                                                                                   PATIENT EDUCATION:  Education details: POC; HEP  Person educated: Patient Education method: Explanation, Demonstration, Tactile cues, Verbal cues, and Handouts Education comprehension: verbalized understanding, returned demonstration, verbal cues required, tactile cues required, and needs further education  HOME EXERCISE PROGRAM: Access Code: [email or web address] URL: [email or web address] Date: 07/23/2023 Prepared by: Marshia Tropea  Exercises - Prone Press Up  - 2 x daily - 7 x weekly - 1 sets - 10 reps - 2-3 sec  hold - Prone Press Up On Elbows  - 2 x daily - 7 x weekly - 1 sets - 3 reps - 30 sec  hold - Standing Back Extension  - 2 x daily - 7 x weekly - 1 sets - 3 reps - 2-3 sec  hold - Prone Gluteal Sets  - 2 x daily - 7 x weekly - 1 sets - 10 reps - 5-10 sec  hold - Seated Hip Flexor Stretch  - 2 x daily - 7 x weekly - 1 sets - 3 reps - 30 sec  hold - Standing Piriformis Release with Ball at Wall  - 2 x daily - 7 x weekly - 30-60 sec  hold  Patient Education - Office Posture  ASSESSMENT:  CLINICAL IMPRESSION: Patient is a 53 y.o. female who was seen today for physical therapy evaluation and treatment for recurrent LBP. E experienced initial symptoms following hysterectomy 5/24 with symptoms resolving in a few weeks. She noticed pain in the same area of LB ~ mid March which have persisted. She has also experienced edema in bilat LE's. Symptoms were better when she was on vacation for the past two weeks but recurred when she started back to work yesterday and have increased today. She has poor posture and alignment with forward flexed trunk. Patient has tightness through the hip flexors, R > L; weakness bilat LE's R > L; pain and tightness with PA mobs lumbar spine and tightness in the R > L lumbar paraspinals. She sits with weight shifted to one hip and has difficulty sitting with equal weight bearing bilat hips. She has sitting job and  sedentary lifestyle sitting ina recliner to rest when home. Patient will benefit from PT to address problems identified.   OBJECTIVE IMPAIRMENTS: Abnormal gait, decreased activity tolerance, decreased mobility, decreased ROM, decreased strength, increased fascial restrictions, increased muscle spasms, improper body mechanics, postural dysfunction, and pain.   ACTIVITY LIMITATIONS: carrying, lifting, sitting, standing, squatting, and transfers  PARTICIPATION LIMITATIONS: occupation  PERSONAL FACTORS: Behavior pattern, Education, Fitness, Past/current experiences, Profession, Time since onset of injury/illness/exacerbation, and comorbidities: hysterectomy 2024 with LBP  on an intermittent basis following surgery for several weeks are also affecting patient's functional outcome.   REHAB POTENTIAL: Good  CLINICAL DECISION MAKING: Evolving/moderate complexity  EVALUATION COMPLEXITY: Moderate   GOALS: Goals reviewed with patient? Yes  SHORT TERM GOALS: Target date: 08/20/2023   Independent in initial HEP  Baseline: Goal status: INITIAL  2.  Patient demonstrates and verbalizes correct posture and alignment in sitting  Baseline:  Goal status: INITIAL  3.  Patient reports decreased pain by 25-50%  Baseline:  Goal status: INITIAL   LONG TERM GOALS: Target date: 09/17/2023   Decrease pain by 75-100% allowing patient to return to normal functional and work activities  Baseline:  Goal status: INITIAL  2.  Patient reports ability to work with no more than 1-3/10 pain level  Baseline:  Goal status: INITIAL  3.  Increased lumbar ROM to [facility] and pain free  Baseline:  Goal status: INITIAL  4.  Increase strength bilat LE's to 4+/5 to 5/5  Baseline:  Goal status: INITIAL  5.  Independent in HEP including aquatic program as indicated  Baseline:  Goal status: INITIAL  6.  Improve Owestry score by 10-20 points  Baseline: 11/50; 22%  Goal status: INITIAL  PLAN:  PT FREQUENCY:  2x/week  PT DURATION: 8 weeks  PLANNED INTERVENTIONS: 97110-Therapeutic exercises, 97530- Therapeutic activity, 97112- Neuromuscular re-education, 97535- Self Care, 11914- Manual therapy, [phone]- Aquatic Therapy, Patient/Family education, Balance training, Stair training, Taping, Dry Needling, and Joint mobilization.  PLAN FOR NEXT SESSION: review and progress exercises; continue with spine care and ergonomic education; manual work and modalities as indicated.   Debie Ashline Hadley Leu, PT 07/23/2023, 3:52 PM

## 2023-07-23 ENCOUNTER — Encounter: Payer: Self-pay | Admitting: Rehabilitative and Restorative Service Providers"

## 2023-07-23 ENCOUNTER — Ambulatory Visit: Attending: Medical-Surgical | Admitting: Rehabilitative and Restorative Service Providers"

## 2023-07-23 ENCOUNTER — Other Ambulatory Visit: Payer: Self-pay

## 2023-07-23 DIAGNOSIS — M545 Low back pain, unspecified: Secondary | ICD-10-CM | POA: Insufficient documentation

## 2023-07-23 DIAGNOSIS — M5459 Other low back pain: Secondary | ICD-10-CM | POA: Diagnosis present

## 2023-07-23 DIAGNOSIS — G8929 Other chronic pain: Secondary | ICD-10-CM | POA: Insufficient documentation

## 2023-07-23 DIAGNOSIS — R29898 Other symptoms and signs involving the musculoskeletal system: Secondary | ICD-10-CM | POA: Insufficient documentation

## 2023-07-23 DIAGNOSIS — M6281 Muscle weakness (generalized): Secondary | ICD-10-CM | POA: Diagnosis present

## 2023-07-30 ENCOUNTER — Encounter: Payer: Self-pay | Admitting: Physical Therapy

## 2023-07-30 ENCOUNTER — Telehealth: Payer: Self-pay | Admitting: Medical-Surgical

## 2023-07-30 ENCOUNTER — Ambulatory Visit: Admitting: Physical Therapy

## 2023-07-30 DIAGNOSIS — M5459 Other low back pain: Secondary | ICD-10-CM

## 2023-07-30 DIAGNOSIS — R29898 Other symptoms and signs involving the musculoskeletal system: Secondary | ICD-10-CM

## 2023-07-30 DIAGNOSIS — M6281 Muscle weakness (generalized): Secondary | ICD-10-CM

## 2023-07-30 NOTE — Therapy (Signed)
 OUTPATIENT PHYSICAL THERAPY THORACOLUMBAR TREATMENT   Patient Name: Michele Roman MRN: [id number] DOB:Apr 27, 1970, 53 y.o., female Today's Date: 07/30/2023  END OF SESSION:  PT End of Session - 07/30/23 1156     Visit Number 2    Number of Visits 16    Date for PT Re-Evaluation 09/17/23    Authorization Type aetna copay none    Authorization Time Period year    Authorization - Visit Number 2    Authorization - Number of Visits 40    PT Start Time 1150    PT Stop Time 1228    PT Time Calculation (min) 38 min    Behavior During Therapy [facility] for tasks assessed/performed              Past Medical History:  Diagnosis Date   Anemia    Endometrial polyp 02/22/2020   Family history of colon cancer 06/10/2018   Father age 90   H/O colonoscopy 03/01/2009   Menorrhagia with regular cycle 02/22/2020   Treated with endometrial ablation January 2022   Past Surgical History:  Procedure Laterality Date   APPENDECTOMY  1997   CERVICAL POLYPECTOMY  04/2020   CESAREAN SECTION  1999   COLONOSCOPY  2012   normal per pt   DILATION AND CURETTAGE, DIAGNOSTIC / THERAPEUTIC  04/2020   EXPLORATORY LAPAROTOMY  1999   Right foot surgery  2008, 2011   hardware in the foot   UTERINE ABLATION  04/2020   Patient Active Problem List   Diagnosis Date Noted   S/P laparoscopic hysterectomy 08/16/2022   Iron  deficiency anemia due to chronic blood loss 03/04/2020   Morbid obesity (HCC) 02/22/2020   Family history of colon cancer 06/10/2018   Anemia, iron  deficiency 06/25/2013   Fatty liver 04/15/2009    PCP: Cherre Cornish, NP  REFERRING PROVIDER: Cherre Cornish, NP  REFERRING DIAG: Chronic midline LBP  Rationale for Evaluation and Treatment: Rehabilitation  THERAPY DIAG:  Other low back pain  Other symptoms and signs involving the musculoskeletal system  Muscle weakness (generalized)  ONSET DATE: 06/15/23  SUBJECTIVE:                                                                                                                                                                                            SUBJECTIVE STATEMENT: I've just been dong the ball.   PERTINENT HISTORY:  LBP following hysterectomy 5/24; "pulled muscle" in back ~ 10-15 years resolved with 3 days of rest; bilat LE edema; obesity, iron  and vitamin D  deficiency  PAIN:  Are you having pain? Yes: NPRS scale: 0/10 Pain location: center of  LB Pain description: knot Aggravating factors: sitting at work  Relieving factors: not working and she is OK sitting at home; recliner   PRECAUTIONS: None  RED FLAGS: None   WEIGHT BEARING RESTRICTIONS: No  FALLS:  Has patient fallen in last 6 months? 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  DIAGNOSTIC FINDINGS:  Xray - 07/02/23: Leftward curvature lumbar spine. Preservation of the vertebral body heights. Degenerative disc disease most pronounced L1-2 and L5-S1. Stool throughout the colon. SI joints are unremarkable.   IMPRESSION: Degenerative disc disease most pronounced L1-2 and L5-S1.  PATIENT SURVEYS:  Oswestry 11/50; 22%    COGNITION: Overall cognitive status: Within functional limits for tasks assessed     SENSATION: WFL  MUSCLE LENGTH: Hamstrings: Right 75 deg; Left 75 deg Thomas test: Right -10-20 deg; Left -10-20 deg  POSTURE: rounded shoulders, forward head, flexed trunk , and LE's in ER   PALPATION: Tightness bilat hip flexors L > R; lumbar paraspinals R > L  Pain with PA  mobs sacrum; lower lumbar    LUMBAR ROM:   AROM eval  Flexion 30% pain   Extension 20% pain   Right lateral flexion 60% pull L  Left lateral flexion 55% pull R   Right rotation 20%   Left rotation 20%   (Blank rows = not tested)  LOWER EXTREMITY ROM:     Active  Right eval Left eval  Hip flexion Tight  Tight   Hip extension Tight  Tight   Hip abduction    Hip adduction    Hip internal rotation Tight   Hip external rotation    Knee flexion    Knee extension    Ankle dorsiflexion    Ankle plantarflexion    Ankle inversion    Ankle eversion     (Blank rows = not tested)  LOWER EXTREMITY MMT:    MMT Right eval Left eval  Hip flexion 4- 4  Hip extension 4- 4  Hip abduction 4- 4  Hip adduction    Hip internal rotation    Hip external rotation    Knee flexion 5 5  Knee extension 5 5  Ankle dorsiflexion    Ankle plantarflexion    Ankle inversion    Ankle eversion     (Blank rows = not tested)  LUMBAR SPECIAL TESTS:  Straight leg raise test: Negative and Slump test: Negative  FUNCTIONAL TESTS:  5 times sit to stand: 20.81 use of UE's "tension in the center of LB'  GAIT: Distance walked: 40 feet Assistive device utilized: None Level of assistance: Complete Independence Comments: LE's in ER; trunk flexed forward at hips   TREATMENT DATE: 07/30/23 Prone  press ups x 10 Prone glute sets 5 sec hold x 10 Prone hip ext x 10 B Prone quad stretch with strap x 1 min B Seated hip flexor stretch 2x30 sec B Standing lumbar at wall x 5 Seated fig 4 2x 30 sec B Standing rows green x 10 with TA contraction Standing ext green x 10 with TA contraction  Pallof isometric green walkout - causes increased pressure/weakness on R LE Hooklying + TA with OH shoulder extension green PT anchoring from head of table x 10 Hooklying + TA + march x 10 B (sequential march too hard)    TREATMENT DATE: 07/23/23 Review of evaluation findings POC HEP as noted below                                                                                                                                   PATIENT EDUCATION:  Education details: HEP Update - pt using App now Person educated: Patient Education method: Explanation, Demonstration, Tactile cues, Verbal cues, and Handouts Education comprehension: verbalized understanding, returned demonstration, verbal cues required, tactile cues required, and needs further education  HOME EXERCISE PROGRAM: Access Code: [email or web address] URL: [email or web address] Date: 07/30/2023 Prepared by: Concha Deed  Exercises - Prone Press Up  - 2 x daily - 7 x weekly - 1 sets - 10 reps - 2-3 sec  hold - Prone Press Up On Elbows  - 2 x daily - 7 x weekly - 1 sets - 3 reps - 30 sec  hold - Standing Back Extension  - 2 x daily - 7 x weekly - 1 sets - 3 reps - 2-3 sec  hold - Prone Gluteal Sets  - 2 x daily - 7 x weekly - 1 sets - 10 reps - 5-10 sec  hold - Seated Hip Flexor Stretch  - 2 x daily - 7 x weekly - 1 sets - 3 reps - 30 sec  hold - Standing Piriformis Release with Ball at Wall  - 2 x daily - 7 x weekly - 30-60 sec  hold - Prone Hip Extension  - 1 x daily - 3 x weekly - 2 sets - 10 reps - Seated Piriformis Stretch with Trunk Bend  - 2 x daily - 7 x weekly - 1 sets - 3 reps - 30-60 sec  hold  Patient Education - Office Posture  ASSESSMENT:  CLINICAL IMPRESSION: No pain reported today. Reviewed and progressed HEP. Patient demonstrates weakness in B hip flexors and core with hooklying TE. She felt some increased sx in R LE with Pallof exercise, but otherwise tolerated core strengthening well.    EVAL: Patient is a 53 y.o. female who was seen today for physical therapy evaluation and treatment for recurrent LBP. E experienced initial symptoms following hysterectomy 5/24 with symptoms resolving in a few weeks. She noticed pain in the same area of LB ~ mid March which have persisted. She has also experienced edema in bilat LE's. Symptoms  were better when she was on vacation for the past two weeks but recurred when she started back to work yesterday and have increased today. She has poor posture and alignment with forward flexed trunk. Patient has tightness through the hip flexors, R > L; weakness bilat LE's R > L; pain and tightness with PA mobs lumbar  spine and tightness in the R > L lumbar paraspinals. She sits with weight shifted to one hip and has difficulty sitting with equal weight bearing bilat hips. She has sitting job and sedentary lifestyle sitting ina recliner to rest when home. Patient will benefit from PT to address problems identified.   OBJECTIVE IMPAIRMENTS: Abnormal gait, decreased activity tolerance, decreased mobility, decreased ROM, decreased strength, increased fascial restrictions, increased muscle spasms, improper body mechanics, postural dysfunction, and pain.   ACTIVITY LIMITATIONS: carrying, lifting, sitting, standing, squatting, and transfers  PARTICIPATION LIMITATIONS: occupation  PERSONAL FACTORS: Behavior pattern, Education, Fitness, Past/current experiences, Profession, Time since onset of injury/illness/exacerbation, and comorbidities: hysterectomy 2024 with LBP on an intermittent basis following surgery for several weeks are also affecting patient's functional outcome.   REHAB POTENTIAL: Good  CLINICAL DECISION MAKING: Evolving/moderate complexity  EVALUATION COMPLEXITY: Moderate   GOALS: Goals reviewed with patient? Yes  SHORT TERM GOALS: Target date: 08/20/2023   Independent in initial HEP  Baseline: Goal status: INITIAL  2.  Patient demonstrates and verbalizes correct posture and alignment in sitting  Baseline:  Goal status: INITIAL  3.  Patient reports decreased pain by 25-50%  Baseline:  Goal status: INITIAL   LONG TERM GOALS: Target date: 09/17/2023   Decrease pain by 75-100% allowing patient to return to normal functional and work activities  Baseline:  Goal status:  INITIAL  2.  Patient reports ability to work with no more than 1-3/10 pain level  Baseline:  Goal status: INITIAL  3.  Increased lumbar ROM to [facility] and pain free  Baseline:  Goal status: INITIAL  4.  Increase strength bilat LE's to 4+/5 to 5/5  Baseline:  Goal status: INITIAL  5.  Independent in HEP including aquatic program as indicated  Baseline:  Goal status: INITIAL  6.  Improve Owestry score by 10-20 points  Baseline: 11/50; 22%  Goal status: INITIAL  PLAN:  PT FREQUENCY: 2x/week  PT DURATION: 8 weeks  PLANNED INTERVENTIONS: 97110-Therapeutic exercises, 97530- Therapeutic activity, 97112- Neuromuscular re-education, 97535- Self Care, 01027- Manual therapy, [id number]- Aquatic Therapy, Patient/Family education, Balance training, Stair training, Taping, Dry Needling, and Joint mobilization.  PLAN FOR NEXT SESSION: review and progress exercises; continue with spine care and ergonomic education; manual work and modalities as indicated.   Jinx Mourning, PT  07/30/2023, 12:32 PM

## 2023-07-30 NOTE — Telephone Encounter (Signed)
 Pt dropped off form to be filled out. Placed in provider's basket.

## 2023-08-01 ENCOUNTER — Ambulatory Visit: Attending: Medical-Surgical | Admitting: Rehabilitative and Restorative Service Providers"

## 2023-08-01 ENCOUNTER — Encounter: Payer: Self-pay | Admitting: Rehabilitative and Restorative Service Providers"

## 2023-08-01 DIAGNOSIS — M6281 Muscle weakness (generalized): Secondary | ICD-10-CM | POA: Insufficient documentation

## 2023-08-01 DIAGNOSIS — R29898 Other symptoms and signs involving the musculoskeletal system: Secondary | ICD-10-CM | POA: Insufficient documentation

## 2023-08-01 DIAGNOSIS — M5459 Other low back pain: Secondary | ICD-10-CM | POA: Diagnosis present

## 2023-08-01 NOTE — Therapy (Signed)
 OUTPATIENT PHYSICAL THERAPY THORACOLUMBAR TREATMENT   Patient Name: Michele Roman MRN: [id number] DOB:04-22-1970, 53 y.o., female Today's Date: 08/01/2023  END OF SESSION:  PT End of Session - 08/01/23 1141     Visit Number 3    Number of Visits 16    Date for PT Re-Evaluation 09/17/23    Authorization Type aetna copay none    Authorization - Visit Number 3    Authorization - Number of Visits 40    PT Start Time 1145    PT Stop Time 1230    PT Time Calculation (min) 45 min    Activity Tolerance Patient tolerated treatment well              Past Medical History:  Diagnosis Date   Anemia    Endometrial polyp 02/22/2020   Family history of colon cancer 06/10/2018   Father age 75   H/O colonoscopy 03/01/2009   Menorrhagia with regular cycle 02/22/2020   Treated with endometrial ablation January 2022   Past Surgical History:  Procedure Laterality Date   APPENDECTOMY  1997   CERVICAL POLYPECTOMY  04/2020   CESAREAN SECTION  1999   COLONOSCOPY  2012   normal per pt   DILATION AND CURETTAGE, DIAGNOSTIC / THERAPEUTIC  04/2020   EXPLORATORY LAPAROTOMY  1999   Right foot surgery  2008, 2011   hardware in the foot   UTERINE ABLATION  04/2020   Patient Active Problem List   Diagnosis Date Noted   S/P laparoscopic hysterectomy 08/16/2022   Iron  deficiency anemia due to chronic blood loss 03/04/2020   Morbid obesity (HCC) 02/22/2020   Family history of colon cancer 06/10/2018   Anemia, iron  deficiency 06/25/2013   Fatty liver 04/15/2009    PCP: Cherre Cornish, NP  REFERRING PROVIDER: Cherre Cornish, NP  REFERRING DIAG: Chronic midline LBP  Rationale for Evaluation and Treatment: Rehabilitation  THERAPY DIAG:  Other low back pain  Other symptoms and signs involving the musculoskeletal system  Muscle weakness (generalized)  ONSET DATE: 06/15/23  SUBJECTIVE:                                                                                                                                                                                            SUBJECTIVE STATEMENT: Improving. Has not been working a full day most days. That has helped. She is waiting for standing desk - paperwork is coming from [place]. Has done a couple of exercises and is using the ball on the wall.   EVAL:  Patient reports that she noticed LBP the day after hysterectomy last year. She treated pain with heat  which was intermittent. She noticed pain and spider like appearance in the R LB area over the past 5 weeks or so. She had xrays which showed some degenerative changes. Feels the symptoms are associated with sitting in chair at work. She was on vacation for the past two weeks and had no discomfort. She returned to work yesterday and pain started again and is worse today. 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 Yes: NPRS scale: 0/10 Pain location: center of LB Pain description: knot Aggravating factors: sitting at work  Relieving factors: not working and she is OK sitting at home; recliner   PRECAUTIONS: None   WEIGHT BEARING RESTRICTIONS: No  FALLS:  Has patient fallen in last 6 months? 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  DIAGNOSTIC FINDINGS:  Xray - 07/02/23: Leftward curvature lumbar spine. Preservation of the  vertebral body heights. Degenerative disc disease most pronounced L1-2 and L5-S1. Stool throughout the colon. SI joints are unremarkable.   IMPRESSION: Degenerative disc disease most pronounced L1-2 and L5-S1.  PATIENT SURVEYS:  Oswestry 11/50; 22%     SENSATION: WFL  MUSCLE LENGTH: Hamstrings: Right 75 deg; Left 75 deg Thomas test: Right -10-20 deg; Left -10-20 deg  POSTURE: rounded shoulders, forward head, flexed trunk , and LE's in ER   PALPATION: Tightness bilat hip flexors L > R; lumbar paraspinals R > L  Pain with PA mobs sacrum; lower lumbar    LUMBAR ROM:   AROM eval  Flexion 30% pain   Extension 20% pain   Right lateral flexion 60% pull L  Left lateral flexion 55% pull R   Right rotation 20%   Left rotation 20%   (Blank rows = not tested)  LOWER EXTREMITY ROM:     Active  Right eval Left eval  Hip flexion Tight  Tight   Hip extension Tight  Tight   Hip abduction    Hip adduction    Hip internal rotation Tight   Hip external rotation    Knee flexion    Knee extension    Ankle dorsiflexion    Ankle plantarflexion    Ankle inversion    Ankle eversion     (Blank rows = not tested)  LOWER EXTREMITY MMT:    MMT Right eval Left eval  Hip flexion 4- 4  Hip extension 4- 4  Hip abduction 4- 4  Hip adduction    Hip internal rotation    Hip external rotation    Knee flexion 5 5  Knee extension 5 5  Ankle dorsiflexion    Ankle plantarflexion    Ankle inversion    Ankle eversion     (Blank rows = not tested)  LUMBAR SPECIAL TESTS:  Straight leg raise test: Negative and Slump test: Negative  FUNCTIONAL TESTS:  5 times sit to stand: 20.81 use  of UE's "tension in the center of LB'  GAIT: Distance walked: 40 feet Assistive device utilized: None Level of assistance: Complete Independence Comments: LE's in ER; trunk flexed forward at hips  TREATMENT DATE: 08/01/23 Therapeutic exercises: Seated figure 4 stretch 30 sec x 3 R/L  Seated hip  flexor stretch 2x30 sec R/L Prone  press ups x 10 Prone glute sets 5 sec hold x 10 Prone hip ext x 10 R/L Prone quad stretch with strap x 30 x 3 R/L  Therapeutic activities:  Hooklying shoulder flexion blue TB between hands 3 sec x 10  Hooklying bridge 5 sec x 10  Standing rows blue 3 sec x 10 with TA contraction Standing ext blue 3 sec  x 10 with TA contraction Pallof push pull from midline one strip blue TB 3 sec x 10 R/L    TREATMENT DATE: 07/30/23 Prone  press ups x 10 Prone glute sets 5 sec hold x 10 Prone hip ext x 10 B Prone quad stretch with strap x 1 min B Seated hip flexor stretch 2x30 sec B Standing lumbar at wall x 5 Seated fig 4 2x 30 sec B Standing rows green x 10 with TA contraction Standing ext green x 10 with TA contraction Pallof isometric green walkout - causes increased pressure/weakness on R LE Hooklying + TA with OH shoulder extension green PT anchoring from head of table x 10 Hooklying + TA + march x 10 B (sequential march too hard)    TREATMENT DATE: 07/23/23 Review of evaluation findings POC HEP as noted below                                                                                                                                  PATIENT EDUCATION:  Education details: HEP Update - pt using App now Person educated: Patient Education method: Explanation, Demonstration, Tactile cues, Verbal cues, and Handouts Education comprehension: verbalized understanding, returned demonstration, verbal cues required, tactile cues required, and needs further education  HOME EXERCISE PROGRAM: Access Code: [email or web address] URL: [email or web address] Date: 08/01/2023 Prepared by: Raeann Offner  Exercises - Prone Press Up  - 2 x daily - 7 x weekly - 1 sets - 10 reps - 2-3 sec  hold - Prone Press Up On Elbows  - 2 x daily - 7 x weekly - 1 sets - 3 reps - 30 sec  hold - Standing Back Extension  - 2 x daily - 7 x weekly - 1 sets - 3 reps - 2-3 sec  hold -  Prone Gluteal Sets  - 2 x daily - 7 x weekly - 1 sets - 10 reps - 5-10 sec  hold - Seated Hip Flexor Stretch  - 2 x daily - 7 x weekly - 1 sets - 3 reps - 30 sec  hold - Standing Piriformis Release with Ball at Wall  - 2 x daily - 7 x weekly - 30-60 sec  hold - Prone Hip Extension  - 1 x daily - 3 x weekly - 2 sets - 10 reps - Seated Piriformis Stretch with Trunk Bend  - 2 x daily - 7 x weekly - 1 sets - 3 reps - 30-60 sec  hold - Supine Shoulder Flexion Extension AAROM with Dowel  - 1 x daily - 7 x weekly - 1-2 sets - 10 reps - 3 sec  hold - Beginner Bridge  - 1 x daily - 7 x weekly - 1 sets - 10 reps - 5 sec  hold - Standing Bilateral Low Shoulder Row with Anchored Resistance  - 1 x daily - 7 x weekly - 1-3 sets - 10 reps - 2-3 sec  hold - Shoulder extension with resistance - Neutral  - 1 x daily - 7 x weekly - 1-2 sets - 10 reps - 3-5 sec  hold - Anti-Rotation Lateral Stepping with Press  - 1 x daily - 7 x weekly - 1-2 sets - 10 reps - 2-3 sec  hold  Patient Education - Office Posture  ASSESSMENT:  CLINICAL IMPRESSION: No pain but has worked reduced hours at work and that is when she notices the pain the most. She is awaiting approval and purchase of standing desk. Continued with LE stretching and core strengthening/stabilization. Reviewed and progressed HEP.   Patient demonstrates weakness in B hip flexors and core with hooklying TE. She felt some increased sx in R LE with Pallof exercise, but otherwise tolerated core strengthening well.    EVAL: Patient is a 53 y.o. female who was seen today for physical therapy evaluation and treatment for recurrent LBP. E experienced initial symptoms following hysterectomy 5/24 with symptoms resolving in a few weeks. She noticed pain in the same area of LB ~ mid March which have persisted. She has also experienced edema in bilat LE's. Symptoms were better when she was on vacation for the past two weeks but recurred when she started back to work yesterday  and have increased today. She has poor posture and alignment with forward flexed trunk. Patient has tightness through the hip flexors, R > L; weakness bilat LE's R > L; pain and tightness with PA mobs lumbar spine and tightness in the R > L lumbar paraspinals. She sits with weight shifted to one hip and has difficulty sitting with equal weight bearing bilat hips. She has sitting job and sedentary lifestyle sitting ina recliner to rest when home. Patient will benefit from PT to address problems identified.    GOALS: Goals reviewed with patient? Yes  SHORT TERM GOALS: Target date: 08/20/2023   Independent in initial HEP  Baseline: Goal status: INITIAL  2.  Patient demonstrates and verbalizes correct posture and alignment in sitting  Baseline:  Goal status: INITIAL  3.  Patient reports decreased pain by 25-50%  Baseline:  Goal status: INITIAL   LONG TERM GOALS: Target date: 09/17/2023   Decrease pain by 75-100% allowing patient to return to normal functional and work activities  Baseline:  Goal status: INITIAL  2.  Patient reports ability to work with no more than 1-3/10 pain level  Baseline:  Goal status: INITIAL  3.  Increased lumbar ROM to [facility] and pain free  Baseline:  Goal status: INITIAL  4.  Increase strength bilat LE's to 4+/5 to 5/5  Baseline:  Goal status: INITIAL  5.  Independent in HEP including aquatic program as indicated  Baseline:  Goal status: INITIAL  6.  Improve Owestry  score by 10-20 points  Baseline: 11/50; 22%  Goal status: INITIAL  PLAN:  PT FREQUENCY: 2x/week  PT DURATION: 8 weeks  PLANNED INTERVENTIONS: 97110-Therapeutic exercises, 97530- Therapeutic activity, 97112- Neuromuscular re-education, 97535- Self Care, 16109- Manual therapy, [phone]- Aquatic Therapy, Patient/Family education, Balance training, Stair training, Taping, Dry Needling, and Joint mobilization.  PLAN FOR NEXT SESSION: review and progress exercises; continue with spine care  and ergonomic education; manual work and modalities as indicated.  Karn Derk P. Geraldo Klippel PT, MPH 08/01/23 11:45 AM

## 2023-08-05 NOTE — Telephone Encounter (Signed)
 Scheduled virtual visit 08/06/2023 at 8:50

## 2023-08-05 NOTE — Therapy (Signed)
 OUTPATIENT PHYSICAL THERAPY THORACOLUMBAR TREATMENT   Patient Name: Michele Roman MRN: [id number] DOB:20-Aug-1970, 53 y.o., female Today's Date: 08/06/2023  END OF SESSION:  PT End of Session - 08/06/23 1149     Visit Number 4    Number of Visits 16    Date for PT Re-Evaluation 09/17/23    Authorization Type aetna copay none    Authorization Time Period year    Authorization - Visit Number 4    Authorization - Number of Visits 40    PT Start Time 1150    PT Stop Time 1231    PT Time Calculation (min) 41 min    Activity Tolerance Patient tolerated treatment well    Behavior During Therapy [facility] for tasks assessed/performed               Past Medical History:  Diagnosis Date   Anemia    Endometrial polyp 02/22/2020   Family history of colon cancer 06/10/2018   Father age 58   H/O colonoscopy 03/01/2009   Menorrhagia with regular cycle 02/22/2020   Treated with endometrial ablation January 2022   Past Surgical History:  Procedure Laterality Date   APPENDECTOMY  1997   CERVICAL POLYPECTOMY  04/2020   CESAREAN SECTION  1999   COLONOSCOPY  2012   normal per pt   DILATION AND CURETTAGE, DIAGNOSTIC / THERAPEUTIC  04/2020   EXPLORATORY LAPAROTOMY  1999   Right foot surgery  2008, 2011   hardware in the foot   UTERINE ABLATION  04/2020   Patient Active Problem List   Diagnosis Date Noted   S/P laparoscopic hysterectomy 08/16/2022   Iron  deficiency anemia due to chronic blood loss 03/04/2020   Morbid obesity (HCC) 02/22/2020   Family history of colon cancer 06/10/2018   Anemia, iron  deficiency 06/25/2013   Fatty liver 04/15/2009    PCP: Cherre Cornish, NP  REFERRING PROVIDER: Cherre Cornish, NP  REFERRING DIAG: Chronic midline LBP  Rationale for Evaluation and Treatment: Rehabilitation  THERAPY DIAG:  Other low back pain  Other symptoms and signs involving the musculoskeletal system  Muscle weakness (generalized)  ONSET DATE: 06/15/23  SUBJECTIVE:                                                                                                                                                                                            SUBJECTIVE STATEMENT: Today my back is a little aggravated.   EVAL:  Patient reports that she noticed LBP the day after hysterectomy last year. She treated pain with heat which was intermittent. She noticed pain and spider like appearance in the R LB  area over the past 5 weeks or so. She had xrays which showed some degenerative changes. Feels the symptoms are associated with sitting in chair at work. She was on vacation for the past two weeks and had no discomfort. She returned to work yesterday and pain started again and is worse today. 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 Yes: NPRS scale: 3/10 Pain location: center of LB Pain description: knot Aggravating factors: sitting at work  Relieving factors: not working and she is OK sitting at home; recliner   PRECAUTIONS: None   WEIGHT BEARING RESTRICTIONS: No  FALLS:  Has patient fallen in last 6 months? 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  DIAGNOSTIC FINDINGS:  Xray - 07/02/23: Leftward curvature lumbar spine. Preservation of the vertebral body heights. Degenerative disc disease most pronounced L1-2  and L5-S1. Stool throughout the colon. SI joints are unremarkable.   IMPRESSION: Degenerative disc disease most pronounced L1-2 and L5-S1.  PATIENT SURVEYS:  Oswestry 11/50; 22%     SENSATION: WFL  MUSCLE LENGTH: Hamstrings: Right 75 deg; Left 75 deg Thomas test: Right -10-20 deg; Left -10-20 deg  POSTURE: rounded shoulders, forward head, flexed trunk , and LE's in ER   PALPATION: Tightness bilat hip flexors L > R; lumbar paraspinals R > L  Pain with PA mobs sacrum; lower lumbar    LUMBAR ROM:   AROM eval  Flexion 30% pain   Extension 20% pain   Right lateral flexion 60% pull L  Left lateral flexion 55% pull R   Right rotation 20%   Left rotation 20%   (Blank rows = not tested)  LOWER EXTREMITY ROM:     Active  Right eval Left eval  Hip flexion Tight  Tight   Hip extension Tight  Tight   Hip abduction    Hip adduction    Hip internal rotation Tight   Hip external rotation    Knee flexion    Knee extension    Ankle dorsiflexion    Ankle plantarflexion    Ankle inversion    Ankle eversion     (Blank rows = not tested)  LOWER EXTREMITY MMT:    MMT Right eval Left eval  Hip flexion 4- 4  Hip extension 4- 4  Hip abduction 4- 4  Hip adduction    Hip internal rotation    Hip external rotation    Knee flexion 5 5  Knee extension 5 5  Ankle dorsiflexion    Ankle plantarflexion    Ankle inversion    Ankle eversion     (Blank rows = not tested)  LUMBAR SPECIAL TESTS:  Straight leg raise test: Negative and Slump test: Negative  FUNCTIONAL TESTS:  5 times sit to stand: 20.81 use of UE's "tension in the center of LB'  GAIT: Distance walked: 40 feet  Assistive device utilized: None Level of assistance: Complete Independence Comments: LE's in ER; trunk flexed forward at hips   TREATMENT DATE: 08/06/23 Therapeutic exercises: Seated figure 4 stretch 30 sec x 3 R/L  Seated hip flexor stretch 3x30 sec R/L R hip flexor eccentric strengthening in Thomas  test position x 8  Therapeutic activities:  Hooklying shoulder flexion blue TB between hands 3 sec x 10  Hooklying bridge 5 sec x 10  Pallof push pull from midline one strip blue and one green TB with pole 3 sec x 10 R/L   Trigger Point Dry Needling  Initial Treatment: Pt instructed on Dry Needling rational, procedures, and possible side effects. Pt instructed to expect mild to moderate muscle soreness later in the day and/or into the next day.  Pt instructed in methods to reduce muscle soreness. Pt instructed to continue prescribed HEP. Patient was educated on signs and symptoms of infection and other risk factors and advised to seek medical attention should they occur.  Patient verbalized understanding of these instructions and education.   Patient Verbal Consent Given: Yes Education Handout Provided: Yes Muscles Treated: L4/5 Lumbar multifidi Electrical Stimulation Performed: No Treatment Response/Outcome: Utilized skilled palpation to identify bony landmarks and trigger points.  Unable to illicit twitch response.   Manual: UPA mobs to L4 and L5, TPR to R psoas with active hip flexion x 10    TREATMENT DATE: 08/01/23 Therapeutic exercises: Seated figure 4 stretch 30 sec x 3 R/L  Seated hip flexor stretch 2x30 sec R/L Prone  press ups x 10 Prone glute sets 5 sec hold x 10 Prone hip ext x 10 R/L Prone quad stretch with strap x 30 x 3 R/L  Therapeutic activities:  Hooklying shoulder flexion blue TB between hands 3 sec x 10  Hooklying bridge 5 sec x 10  Standing rows blue 3 sec x 10 with TA contraction Standing ext blue 3 sec  x 10 with TA contraction Pallof push pull from midline one strip blue TB 3 sec x 10 R/L    TREATMENT DATE: 07/30/23 Prone  press ups x 10 Prone glute sets 5 sec hold x 10 Prone hip ext x 10 B Prone quad stretch with strap x 1 min B Seated hip flexor stretch 2x30 sec B Standing lumbar at wall x 5 Seated fig 4 2x 30 sec B Standing rows green x 10  with TA contraction Standing ext green x 10 with TA contraction Pallof isometric green walkout - causes increased pressure/weakness on R LE Hooklying + TA with OH shoulder extension green PT anchoring from head of table x 10 Hooklying + TA + march x 10 B (sequential march too hard)    TREATMENT DATE: 07/23/23 Review of evaluation findings POC HEP as noted below  PATIENT EDUCATION:  Education details: HEP Update - pt using App now Person educated: Patient Education method: Explanation, Demonstration, Tactile cues, Verbal cues, and Handouts Education comprehension: verbalized understanding, returned demonstration, verbal cues required, tactile cues required, and needs further education  HOME EXERCISE PROGRAM: Access Code: [email or web address] URL: [email or web address] Date: 08/06/2023 Prepared by: Concha Deed  Exercises - Prone Press Up  - 2 x daily - 7 x weekly - 1 sets - 10 reps - 2-3 sec  hold - Prone Press Up On Elbows  - 2 x daily - 7 x weekly - 1 sets - 3 reps - 30 sec  hold - Standing Back Extension  - 2 x daily - 7 x weekly - 1 sets - 3 reps - 2-3 sec  hold - Prone Gluteal Sets  - 2 x daily - 7 x weekly - 1 sets - 10 reps - 5-10 sec  hold - Seated Hip Flexor Stretch  - 2 x daily - 7 x weekly - 1 sets - 3 reps - 30 sec  hold - Standing Piriformis Release with Ball at [employer]  - 2 x daily - 7 x weekly - 30-60 sec  hold - Prone Hip Extension  - 1 x daily - 3 x weekly - 2 sets - 10 reps - Seated Piriformis Stretch with Trunk Bend  - 2 x daily - 7 x weekly - 1 sets - 3 reps - 30-60 sec  hold - Supine Shoulder Flexion Extension AAROM with Dowel  - 1 x daily - 7 x weekly - 1-2 sets - 10 reps - 3 sec  hold - Beginner Bridge  - 1 x daily - 7 x weekly - 1 sets - 10 reps - 5 sec  hold - Standing Bilateral Low Shoulder Row with Anchored Resistance  - 1 x daily - 7 x  weekly - 1-3 sets - 10 reps - 2-3 sec  hold - Shoulder extension with resistance - Neutral  - 1 x daily - 7 x weekly - 1-2 sets - 10 reps - 3-5 sec  hold - Anti-Rotation Lateral Stepping with Press  - 1 x daily - 7 x weekly - 1-2 sets - 10 reps - 2-3 sec  hold - Eccentric Hip Flexor Strengthening (Mirrored)  - 1 x daily - 7 x weekly - 2 sets - 10 reps  ASSESSMENT:  CLINICAL IMPRESSION: Increased LBP today and feeling of knot again today in L4/5 region. Trial of DN did not elicit a twitch response. Pain with R UPA mobs at same levels. Tightness noted in R psoas which responded well to TPR. Started hip flexor strengthening after manual. This should be focused on next session.    EVAL: Patient is a 53 y.o. female who was seen today for physical therapy evaluation and treatment for recurrent LBP. E experienced initial symptoms following hysterectomy 5/24 with symptoms resolving in a few weeks. She noticed pain in the same area of LB ~ mid March which have persisted. She has also experienced edema in bilat LE's. Symptoms were better when she was on vacation for the past two weeks but recurred when she started back to work yesterday and have increased today. She has poor posture and alignment with forward flexed trunk. Patient has tightness through the hip flexors, R > L; weakness bilat LE's R > L; pain and tightness with PA mobs lumbar spine and tightness in the R > L lumbar paraspinals. She sits with weight shifted to one hip and has difficulty  sitting with equal weight bearing bilat hips. She has sitting job and sedentary lifestyle sitting ina recliner to rest when home. Patient will benefit from PT to address problems identified.    GOALS: Goals reviewed with patient? Yes  SHORT TERM GOALS: Target date: 08/20/2023   Independent in initial HEP  Baseline: Goal status: INITIAL  2.  Patient demonstrates and verbalizes correct posture and alignment in sitting  Baseline:  Goal status: INITIAL  3.   Patient reports decreased pain by 25-50%  Baseline:  Goal status: INITIAL   LONG TERM GOALS: Target date: 09/17/2023   Decrease pain by 75-100% allowing patient to return to normal functional and work activities  Baseline:  Goal status: INITIAL  2.  Patient reports ability to work with no more than 1-3/10 pain level  Baseline:  Goal status: INITIAL  3.  Increased lumbar ROM to [facility] and pain free  Baseline:  Goal status: INITIAL  4.  Increase strength bilat LE's to 4+/5 to 5/5  Baseline:  Goal status: INITIAL  5.  Independent in HEP including aquatic program as indicated  Baseline:  Goal status: INITIAL  6.  Improve Owestry score by 10-20 points  Baseline: 11/50; 22%  Goal status: INITIAL  PLAN:  PT FREQUENCY: 2x/week  PT DURATION: 8 weeks  PLANNED INTERVENTIONS: 97110-Therapeutic exercises, 97530- Therapeutic activity, 97112- Neuromuscular re-education, 97535- Self Care, 40981- Manual therapy, [phone]- Aquatic Therapy, Patient/Family education, Balance training, Stair training, Taping, Dry Needling, and Joint mobilization.  PLAN FOR NEXT SESSION: work on R hip flexor strength and core strength, continue with TPR and mobs prn.  Jinx Mourning, PT  08/06/23 12:38 PM

## 2023-08-06 ENCOUNTER — Telehealth (INDEPENDENT_AMBULATORY_CARE_PROVIDER_SITE_OTHER): Admitting: Medical-Surgical

## 2023-08-06 ENCOUNTER — Encounter: Payer: Self-pay | Admitting: Physical Therapy

## 2023-08-06 ENCOUNTER — Ambulatory Visit: Admitting: Physical Therapy

## 2023-08-06 ENCOUNTER — Encounter: Payer: Self-pay | Admitting: Medical-Surgical

## 2023-08-06 DIAGNOSIS — R29898 Other symptoms and signs involving the musculoskeletal system: Secondary | ICD-10-CM

## 2023-08-06 DIAGNOSIS — M5459 Other low back pain: Secondary | ICD-10-CM | POA: Diagnosis not present

## 2023-08-06 DIAGNOSIS — Z0289 Encounter for other administrative examinations: Secondary | ICD-10-CM

## 2023-08-06 DIAGNOSIS — M6281 Muscle weakness (generalized): Secondary | ICD-10-CM

## 2023-08-06 NOTE — Progress Notes (Signed)
 Virtual Visit via Video Note  I connected with Michele Roman on 08/06/23 at  8:50 AM EDT by a video enabled telemedicine application and verified that I am speaking with the correct person using two identifiers.   I discussed the limitations of evaluation and management by telemedicine and the availability of in person appointments. The patient expressed understanding and agreed to proceed.  Patient location: home Provider locations: office  Subjective:    CC: Form completion  HPI: Pleasant 53 year old female presenting today via MyChart video visit to assist with completing forms for work accommodation.  She reports that she has significant back pain that is exacerbated by using the chairs provided at her office.  We had requested a new chair that is ergonomic along with a sit to stand desk.  A letter was written and she provided this to her supervisor.  She was told new forms have been implemented and these need to be completed in order to honor the request.  She spends most of her day sitting although she has to get up to walk from one place to another at times.  Notes that most of her back pain affects her while sitting for prolonged periods in the chair that she has been provided.  Past medical history, Surgical history, Family history not pertinant except as noted below, Social history, Allergies, and medications have been entered into the medical record, reviewed, and corrections made.   Review of Systems: See HPI for pertinent positives and negatives.   Objective:    General: Speaking clearly in complete sentences without any shortness of breath.  Alert and oriented x3.  Normal judgment. No apparent acute distress.  Impression and Recommendations:    1. Encounter for completion of form with patient (Primary) Reviewed forms with patient and clarified any questions.  I will make sure to get these done as soon as possible and send her a message once they have been faxed and confirmed  received.  I discussed the assessment and treatment plan with the patient. The patient was provided an opportunity to ask questions and all were answered. The patient agreed with the plan and demonstrated an understanding of the instructions.   The patient was advised to call back or seek an in-person evaluation if the symptoms worsen or if the condition fails to improve as anticipated.  Return if symptoms worsen or fail to improve.  Michele Snook, DNP, APRN, FNP-BC [facility]

## 2023-08-06 NOTE — Patient Instructions (Signed)

## 2023-08-07 NOTE — Therapy (Signed)
 OUTPATIENT PHYSICAL THERAPY THORACOLUMBAR TREATMENT   Patient Name: ROMONA GRZYWACZ MRN: [id number] DOB:01/16/1971, 53 y.o., female Today's Date: 08/08/2023  END OF SESSION:  PT End of Session - 08/08/23 1233     Visit Number 5    Number of Visits 16    Date for PT Re-Evaluation 09/17/23    Authorization Type aetna copay none    Authorization Time Period year    Authorization - Visit Number 5    Authorization - Number of Visits 40    PT Start Time 1148    PT Stop Time 1233    PT Time Calculation (min) 45 min    Activity Tolerance Patient tolerated treatment well    Behavior During Therapy [facility] for tasks assessed/performed                Past Medical History:  Diagnosis Date   Anemia    Endometrial polyp 02/22/2020   Family history of colon cancer 06/10/2018   Father age 52   H/O colonoscopy 03/01/2009   Menorrhagia with regular cycle 02/22/2020   Treated with endometrial ablation January 2022   Past Surgical History:  Procedure Laterality Date   APPENDECTOMY  1997   CERVICAL POLYPECTOMY  04/2020   CESAREAN SECTION  1999   COLONOSCOPY  2012   normal per pt   DILATION AND CURETTAGE, DIAGNOSTIC / THERAPEUTIC  04/2020   EXPLORATORY LAPAROTOMY  1999   Right foot surgery  2008, 2011   hardware in the foot   UTERINE ABLATION  04/2020   Patient Active Problem List   Diagnosis Date Noted   S/P laparoscopic hysterectomy 08/16/2022   Iron  deficiency anemia due to chronic blood loss 03/04/2020   Morbid obesity (HCC) 02/22/2020   Family history of colon cancer 06/10/2018   Anemia, iron  deficiency 06/25/2013   Fatty liver 04/15/2009    PCP: Cherre Cornish, NP  REFERRING PROVIDER: Cherre Cornish, NP  REFERRING DIAG: Chronic midline LBP  Rationale for Evaluation and Treatment: Rehabilitation  THERAPY DIAG:  Other low back pain  Other symptoms and signs involving the musculoskeletal system  Muscle weakness (generalized)  ONSET DATE: 06/15/23  SUBJECTIVE:                                                                                                                                                                                            SUBJECTIVE STATEMENT: I don't feel the knot anymore.    EVAL:  Patient reports that she noticed LBP the day after hysterectomy last year. She treated pain with heat which was intermittent. She noticed pain and spider like appearance in the R  LB area over the past 5 weeks or so. She had xrays which showed some degenerative changes. Feels the symptoms are associated with sitting in chair at work. She was on vacation for the past two weeks and had no discomfort. She returned to work yesterday and pain started again and is worse today. 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 Yes: NPRS scale: 0/10 Pain location: center of LB Pain description: knot Aggravating factors: sitting at work  Relieving factors: not working and she is OK sitting at home; recliner   PRECAUTIONS: None   WEIGHT BEARING RESTRICTIONS: No  FALLS:  Has patient fallen in last 6 months? 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  DIAGNOSTIC FINDINGS:  Xray - 07/02/23: Leftward curvature lumbar spine. Preservation of the vertebral body heights. Degenerative disc disease most pronounced L1-2 and  L5-S1. Stool throughout the colon. SI joints are unremarkable.   IMPRESSION: Degenerative disc disease most pronounced L1-2 and L5-S1.  PATIENT SURVEYS:  Oswestry 11/50; 22%     SENSATION: WFL  MUSCLE LENGTH: Hamstrings: Right 75 deg; Left 75 deg Thomas test: Right -10-20 deg; Left -10-20 deg  POSTURE: rounded shoulders, forward head, flexed trunk , and LE's in ER   PALPATION: Tightness bilat hip flexors L > R; lumbar paraspinals R > L  Pain with PA mobs sacrum; lower lumbar    LUMBAR ROM:   AROM eval  Flexion 30% pain   Extension 20% pain   Right lateral flexion 60% pull L  Left lateral flexion 55% pull R   Right rotation 20%   Left rotation 20%   (Blank rows = not tested)  LOWER EXTREMITY ROM:     Active  Right eval Left eval  Hip flexion Tight  Tight   Hip extension Tight  Tight   Hip abduction    Hip adduction    Hip internal rotation Tight   Hip external rotation    Knee flexion    Knee extension    Ankle dorsiflexion    Ankle plantarflexion    Ankle inversion    Ankle eversion     (Blank rows = not tested)  LOWER EXTREMITY MMT:    MMT Right eval Left eval  Hip flexion 4- 4  Hip extension 4- 4  Hip abduction 4- 4  Hip adduction    Hip internal rotation    Hip external rotation    Knee flexion 5 5  Knee extension 5 5  Ankle dorsiflexion    Ankle plantarflexion    Ankle inversion    Ankle eversion     (Blank rows = not tested)  LUMBAR SPECIAL TESTS:  Straight leg raise test: Negative and Slump test: Negative  FUNCTIONAL TESTS:  5 times sit to stand: 20.81 use of UE's "tension in the center of LB'  GAIT: Distance walked: 40  feet Assistive device utilized: None Level of assistance: Complete Independence Comments: LE's in ER; trunk flexed forward at hips  TREATMENT DATE: 08/08/23 Therapeutic exercises: Attempted single leg hip flexion with green band resistance and stepping up on 8 inch step but too challenging, so went to no  resistance from floor x 20 B with B UE support on poles Modified mountain climbers at counter x 10 ea Standing hip extension to flexion on slider x 10 B - poles for UE support Standing hip ABD/ADD on slider x 10 B - poles for UE support R hip flexor eccentric strengthening in Thomas test position x 8 R, x 10 L, then R x 3 pt fatigues - cues for slow pace and full range to mat table  Therapeutic activities:  Standing shoulder flexion blue + red for core x 20 Pallof push pull from midline two blue with pole 3 sec R/L    TREATMENT DATE: 08/06/23 Therapeutic exercises: Seated figure 4 stretch 30 sec x 3 R/L  Seated hip flexor stretch 3x30 sec R/L R hip flexor eccentric strengthening in Thomas test position x 8  Therapeutic activities:  Hooklying shoulder flexion blue TB between hands 3 sec x 10  Hooklying bridge 5 sec x 10  Pallof push pull from midline one strip blue and one green TB with pole 3 sec x 10 R/L   Trigger Point Dry Needling  Initial Treatment: Pt instructed on Dry Needling rational, procedures, and possible side effects. Pt instructed to expect mild to moderate muscle soreness later in the day and/or into the next day.  Pt instructed in methods to reduce muscle soreness. Pt instructed to continue prescribed HEP. Patient was educated on signs and symptoms of infection and other risk factors and advised to seek medical attention should they occur.  Patient verbalized understanding of these instructions and education.   Patient Verbal Consent Given: Yes Education Handout Provided: Yes Muscles Treated: L4/5 Lumbar multifidi Electrical Stimulation Performed: No Treatment Response/Outcome: Utilized skilled palpation to identify bony landmarks and trigger points.  Unable to illicit twitch response.   Manual: UPA mobs to L4 and L5, TPR to R psoas with active hip flexion x 10   TREATMENT DATE: 08/01/23 Therapeutic exercises: Seated figure 4 stretch 30 sec x 3 R/L  Seated hip  flexor stretch 2x30 sec R/L Prone  press ups x 10 Prone glute sets 5 sec hold x 10 Prone hip ext x 10 R/L Prone quad stretch with strap x 30 x 3 R/L  Therapeutic activities:  Hooklying shoulder flexion blue TB between hands 3 sec x 10  Hooklying bridge 5 sec x 10  Standing rows blue 3 sec x 10 with TA contraction Standing ext blue 3 sec  x 10 with TA contraction Pallof push pull from midline one strip blue TB 3 sec x 10 R/L    TREATMENT DATE: 07/30/23 Prone  press ups x 10 Prone glute sets 5 sec hold x 10 Prone hip ext x 10 B Prone quad stretch with strap x 1 min B Seated hip flexor stretch 2x30 sec B Standing lumbar at wall x 5 Seated fig 4 2x 30 sec B Standing rows green x 10 with TA contraction Standing ext green x 10 with TA contraction Pallof isometric green walkout - causes increased pressure/weakness on R LE Hooklying + TA with OH shoulder extension green PT anchoring from head of table x 10 Hooklying + TA + march x 10 B (sequential march too hard)  TREATMENT DATE: 07/23/23 Review of evaluation findings POC HEP as noted below                                                                                                                                  PATIENT EDUCATION:  Education details: HEP Update - pt using App now Person educated: Patient Education method: Explanation, Demonstration, Tactile cues, Verbal cues, and Handouts Education comprehension: verbalized understanding, returned demonstration, verbal cues required, tactile cues required, and needs further education  HOME EXERCISE PROGRAM: Access Code: [email or web address] URL: [email or web address] Date: 08/06/2023 Prepared by: Concha Deed  Exercises - Prone Press Up  - 2 x daily - 7 x weekly - 1 sets - 10 reps - 2-3 sec  hold - Prone Press Up On Elbows  - 2 x daily - 7 x weekly - 1 sets - 3 reps - 30 sec  hold - Standing Back Extension  - 2 x daily - 7 x weekly - 1 sets - 3 reps - 2-3 sec  hold - Prone  Gluteal Sets  - 2 x daily - 7 x weekly - 1 sets - 10 reps - 5-10 sec  hold - Seated Hip Flexor Stretch  - 2 x daily - 7 x weekly - 1 sets - 3 reps - 30 sec  hold - Standing Piriformis Release with Ball at [employer]  - 2 x daily - 7 x weekly - 30-60 sec  hold - Prone Hip Extension  - 1 x daily - 3 x weekly - 2 sets - 10 reps - Seated Piriformis Stretch with Trunk Bend  - 2 x daily - 7 x weekly - 1 sets - 3 reps - 30-60 sec  hold - Supine Shoulder Flexion Extension AAROM with Dowel  - 1 x daily - 7 x weekly - 1-2 sets - 10 reps - 3 sec  hold - Beginner Bridge  - 1 x daily - 7 x weekly - 1 sets - 10 reps - 5 sec  hold - Standing Bilateral Low Shoulder Row with Anchored Resistance  - 1 x daily - 7 x weekly - 1-3 sets - 10 reps - 2-3 sec  hold - Shoulder extension with resistance - Neutral  - 1 x daily - 7 x weekly - 1-2 sets - 10 reps - 3-5 sec  hold - Anti-Rotation Lateral Stepping with Press  - 1 x daily - 7 x weekly - 1-2 sets - 10 reps - 2-3 sec  hold - Eccentric Hip Flexor Strengthening (Mirrored)  - 1 x daily - 7 x weekly - 2 sets - 10 reps  ASSESSMENT:  CLINICAL IMPRESSION: Patient with no pain today and her knot is gone. We focused on hip flexor strengthening. Her R psoas fatigues quickly with eccentric strengthening. Patient needs cueing for full range hip flexion with many of the exercises.    EVAL: Patient is a 53 y.o. female who was seen today  for physical therapy evaluation and treatment for recurrent LBP. E experienced initial symptoms following hysterectomy 5/24 with symptoms resolving in a few weeks. She noticed pain in the same area of LB ~ mid March which have persisted. She has also experienced edema in bilat LE's. Symptoms were better when she was on vacation for the past two weeks but recurred when she started back to work yesterday and have increased today. She has poor posture and alignment with forward flexed trunk. Patient has tightness through the hip flexors, R > L; weakness bilat  LE's R > L; pain and tightness with PA mobs lumbar spine and tightness in the R > L lumbar paraspinals. She sits with weight shifted to one hip and has difficulty sitting with equal weight bearing bilat hips. She has sitting job and sedentary lifestyle sitting ina recliner to rest when home. Patient will benefit from PT to address problems identified.    GOALS: Goals reviewed with patient? Yes  SHORT TERM GOALS: Target date: 08/20/2023   Independent in initial HEP  Baseline: Goal status: INITIAL  2.  Patient demonstrates and verbalizes correct posture and alignment in sitting  Baseline:  Goal status: INITIAL  3.  Patient reports decreased pain by 25-50%  Baseline:  Goal status: INITIAL   LONG TERM GOALS: Target date: 09/17/2023   Decrease pain by 75-100% allowing patient to return to normal functional and work activities  Baseline:  Goal status: INITIAL  2.  Patient reports ability to work with no more than 1-3/10 pain level  Baseline:  Goal status: INITIAL  3.  Increased lumbar ROM to [facility] and pain free  Baseline:  Goal status: INITIAL  4.  Increase strength bilat LE's to 4+/5 to 5/5  Baseline:  Goal status: INITIAL  5.  Independent in HEP including aquatic program as indicated  Baseline:  Goal status: INITIAL  6.  Improve Owestry score by 10-20 points  Baseline: 11/50; 22%  Goal status: INITIAL  PLAN:  PT FREQUENCY: 2x/week  PT DURATION: 8 weeks  PLANNED INTERVENTIONS: 97110-Therapeutic exercises, 97530- Therapeutic activity, 97112- Neuromuscular re-education, 97535- Self Care, [id number]- Manual therapy, [phone]- Aquatic Therapy, Patient/Family education, Balance training, Stair training, Taping, Dry Needling, and Joint mobilization.  PLAN FOR NEXT SESSION: continue to focus on  R hip flexor strength and core strength, continue with TPR and mobs prn.  Jinx Mourning, PT  08/08/23 12:57 PM

## 2023-08-08 ENCOUNTER — Encounter: Payer: Self-pay | Admitting: Physical Therapy

## 2023-08-08 ENCOUNTER — Ambulatory Visit: Admitting: Physical Therapy

## 2023-08-08 DIAGNOSIS — M5459 Other low back pain: Secondary | ICD-10-CM

## 2023-08-08 DIAGNOSIS — R29898 Other symptoms and signs involving the musculoskeletal system: Secondary | ICD-10-CM

## 2023-08-08 DIAGNOSIS — M6281 Muscle weakness (generalized): Secondary | ICD-10-CM

## 2023-08-12 NOTE — Therapy (Signed)
 OUTPATIENT PHYSICAL THERAPY THORACOLUMBAR TREATMENT   Patient Name: Michele Roman MRN: [id number] DOB:08/21/1970, 53 y.o., female Today's Date: 08/13/2023  END OF SESSION:  PT End of Session - 08/13/23 1146     Visit Number 6    Number of Visits 16    Date for PT Re-Evaluation 09/17/23    Authorization Type aetna copay none    Authorization Time Period year    Authorization - Visit Number 6    Authorization - Number of Visits 40    PT Start Time 1146    PT Stop Time 1227    PT Time Calculation (min) 41 min    Activity Tolerance Patient tolerated treatment well    Behavior During Therapy [facility] for tasks assessed/performed                 Past Medical History:  Diagnosis Date   Anemia    Endometrial polyp 02/22/2020   Family history of colon cancer 06/10/2018   Father age 78   H/O colonoscopy 03/01/2009   Menorrhagia with regular cycle 02/22/2020   Treated with endometrial ablation January 2022   Past Surgical History:  Procedure Laterality Date   APPENDECTOMY  1997   CERVICAL POLYPECTOMY  04/2020   CESAREAN SECTION  1999   COLONOSCOPY  2012   normal per pt   DILATION AND CURETTAGE, DIAGNOSTIC / THERAPEUTIC  04/2020   EXPLORATORY LAPAROTOMY  1999   Right foot surgery  2008, 2011   hardware in the foot   UTERINE ABLATION  04/2020   Patient Active Problem List   Diagnosis Date Noted   S/P laparoscopic hysterectomy 08/16/2022   Iron  deficiency anemia due to chronic blood loss 03/04/2020   Morbid obesity (HCC) 02/22/2020   Family history of colon cancer 06/10/2018   Anemia, iron  deficiency 06/25/2013   Fatty liver 04/15/2009    PCP: Cherre Cornish, NP  REFERRING PROVIDER: Cherre Cornish, NP  REFERRING DIAG: Chronic midline LBP  Rationale for Evaluation and Treatment: Rehabilitation  THERAPY DIAG:  Other low back pain  Other symptoms and signs involving the musculoskeletal system  Muscle weakness (generalized)  ONSET DATE: 06/15/23  SUBJECTIVE:                                                                                                                                                                                            SUBJECTIVE STATEMENT: Feeling a little discomfort in my back after working this morning. New chair ordered today. Knot is present.   EVAL:  Patient reports that she noticed LBP the day after hysterectomy last year. She treated pain with heat which  was intermittent. She noticed pain and spider like appearance in the R LB area over the past 5 weeks or so. She had xrays which showed some degenerative changes. Feels the symptoms are associated with sitting in chair at work. She was on vacation for the past two weeks and had no discomfort. She returned to work yesterday and pain started again and is worse today. 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 Yes: NPRS scale: 0/10 Pain location: center of LB Pain description: knot Aggravating factors: sitting at work  Relieving factors: not working and she is OK sitting at home; recliner   PRECAUTIONS: None   WEIGHT BEARING RESTRICTIONS: No  FALLS:  Has patient fallen in last 6 months? 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  DIAGNOSTIC FINDINGS:  Xray - 07/02/23: Leftward curvature lumbar spine. Preservation of  the vertebral body heights. Degenerative disc disease most pronounced L1-2 and L5-S1. Stool throughout the colon. SI joints are unremarkable.   IMPRESSION: Degenerative disc disease most pronounced L1-2 and L5-S1.  PATIENT SURVEYS:  Oswestry 11/50; 22%     SENSATION: WFL  MUSCLE LENGTH: Hamstrings: Right 75 deg; Left 75 deg Thomas test: Right -10-20 deg; Left -10-20 deg  POSTURE: rounded shoulders, forward head, flexed trunk , and LE's in ER   PALPATION: Tightness bilat hip flexors L > R; lumbar paraspinals R > L  Pain with PA mobs sacrum; lower lumbar    LUMBAR ROM:   AROM eval  Flexion 30% pain   Extension 20% pain   Right lateral flexion 60% pull L  Left lateral flexion 55% pull R   Right rotation 20%   Left rotation 20%   (Blank rows = not tested)  LOWER EXTREMITY ROM:     Active  Right eval Left eval  Hip flexion Tight  Tight   Hip extension Tight  Tight   Hip abduction    Hip adduction    Hip internal rotation Tight   Hip external rotation    Knee flexion    Knee extension    Ankle dorsiflexion    Ankle plantarflexion    Ankle inversion    Ankle eversion     (Blank rows = not tested)  LOWER EXTREMITY MMT:    MMT Right eval Left eval  Hip flexion 4- 4  Hip extension 4- 4  Hip abduction 4- 4  Hip adduction    Hip internal rotation    Hip external rotation    Knee flexion 5 5  Knee extension 5 5  Ankle dorsiflexion    Ankle plantarflexion    Ankle inversion    Ankle eversion     (Blank rows = not tested)  LUMBAR SPECIAL TESTS:  Straight leg raise test: Negative and Slump test: Negative  FUNCTIONAL TESTS:  5 times sit to stand: 20.81 use of  UE's "tension in the center of LB'  GAIT: Distance walked: 40 feet Assistive device utilized: None Level of assistance: Complete Independence Comments: LE's in ER; trunk flexed forward at hips . TREATMENT DATE: 08/13/23 Therapeutic exercises: single leg hip flexion step up on 6 inch step  x 20  B with one UE support Modified mountain climbers at counter x 10 ea does not feel a lot in core Standing hip extension to flexion on slider x 10 B - poles for UE support Standing hip ABD/ADD on slider x 10 B - poles for UE support R hip flexor eccentric strengthening in Azure test position - unable to do - fatigued from exercises below S/L R hip flexion (SLR) 2x 10 S/L R hip ABD too difficult Supine hip ABD to fatigue  Therapeutic activities:  Standing shoulder flexion blue in split stance 2 x 10 Pallof push pull from midline two blue with pole 3 sec R/L  Bridge 5 sec hold 2x10 (attempted bridge with knee ext but too hard)  Neuromuscular Re-education: Plank at mat table max hold x 3 reps PPT with SLR too difficult, sequential march too difficult on R   TREATMENT DATE: 08/08/23 Therapeutic exercises: Attempted single leg hip flexion with green band resistance and stepping up on 8 inch step but too challenging, so went to no resistance from floor x 20 B with B UE support on poles Modified mountain climbers at counter x 10 ea Standing hip extension to flexion on slider x 10 B - poles for UE support Standing hip ABD/ADD on slider x 10 B - poles for UE support R hip flexor eccentric strengthening in Thomas test position x 8 R, x 10 L, then R x 3 pt fatigues - cues for slow pace and full range to mat table  Therapeutic activities:  Standing shoulder flexion blue + red for core x 20 Pallof push pull from midline two blue with pole 3 sec R/L    TREATMENT DATE: 08/06/23 Therapeutic exercises: Seated figure 4 stretch 30 sec x 3 R/L  Seated hip flexor stretch 3x30 sec R/L R hip flexor eccentric strengthening in Thomas test position x 8  Therapeutic activities:  Hooklying shoulder flexion blue TB between hands 3 sec x 10  Hooklying bridge 5 sec x 10  Pallof push pull from midline one strip blue and one green TB with pole 3 sec x 10 R/L   Trigger Point Dry Needling  Initial Treatment: Pt  instructed on Dry Needling rational, procedures, and possible side effects. Pt instructed to expect mild to moderate muscle soreness later in the day and/or into the next day.  Pt instructed in methods to reduce muscle soreness. Pt instructed to continue prescribed HEP. Patient was educated on signs and symptoms of infection and other risk factors and advised to seek medical attention should they occur.  Patient verbalized understanding of these instructions and education.   Patient Verbal Consent Given: Yes Education Handout Provided: Yes Muscles Treated: L4/5 Lumbar multifidi Electrical Stimulation Performed: No Treatment Response/Outcome: Utilized skilled palpation to identify bony landmarks and trigger points.  Unable to illicit twitch response.   Manual: UPA mobs to L4 and L5, TPR to R psoas with active hip flexion x 10   TREATMENT DATE: 08/01/23 Therapeutic exercises: Seated figure 4 stretch 30 sec x 3 R/L  Seated hip flexor stretch 2x30 sec R/L Prone  press ups x 10 Prone glute sets 5 sec hold x 10 Prone hip ext x 10 R/L Prone quad  stretch with strap x 30 x 3 R/L  Therapeutic activities:  Hooklying shoulder flexion blue TB between hands 3 sec x 10  Hooklying bridge 5 sec x 10  Standing rows blue 3 sec x 10 with TA contraction Standing ext blue 3 sec  x 10 with TA contraction Pallof push pull from midline one strip blue TB 3 sec x 10 R/L    TREATMENT DATE: 07/30/23 Prone  press ups x 10 Prone glute sets 5 sec hold x 10 Prone hip ext x 10 B Prone quad stretch with strap x 1 min B Seated hip flexor stretch 2x30 sec B Standing lumbar at wall x 5 Seated fig 4 2x 30 sec B Standing rows green x 10 with TA contraction Standing ext green x 10 with TA contraction Pallof isometric green walkout - causes increased pressure/weakness on R LE Hooklying + TA with OH shoulder extension green PT anchoring from head of table x 10 Hooklying + TA + march x 10 B (sequential march too hard)     TREATMENT DATE: 07/23/23 Review of evaluation findings POC HEP as noted below                                                                                                                                  PATIENT EDUCATION:  Education details: HEP Update - pt using App now Person educated: Patient Education method: Explanation, Demonstration, Tactile cues, Verbal cues, and Handouts Education comprehension: verbalized understanding, returned demonstration, verbal cues required, tactile cues required, and needs further education  HOME EXERCISE PROGRAM: Access Code: [email or web address] URL: [email or web address] Date: 08/13/2023 Prepared by: Concha Deed  Exercises - Prone Press Up  - 2 x daily - 7 x weekly - 1 sets - 10 reps - 2-3 sec  hold - Prone Press Up On Elbows  - 2 x daily - 7 x weekly - 1 sets - 3 reps - 30 sec  hold - Standing Back Extension  - 2 x daily - 7 x weekly - 1 sets - 3 reps - 2-3 sec  hold - Seated Hip Flexor Stretch  - 2 x daily - 7 x weekly - 1 sets - 3 reps - 30 sec  hold - Standing Piriformis Release with Ball at [employer]  - 2 x daily - 7 x weekly - 30-60 sec  hold - Prone Hip Extension  - 1 x daily - 3 x weekly - 2 sets - 10 reps - Seated Piriformis Stretch with Trunk Bend  - 2 x daily - 7 x weekly - 1 sets - 3 reps - 30-60 sec  hold - Supine Shoulder Flexion Extension AAROM with Dowel  - 1 x daily - 7 x weekly - 1-2 sets - 10 reps - 3 sec  hold - Beginner Bridge  - 1 x daily - 7 x weekly - 1 sets - 10 reps - 5 sec  hold - Standing Bilateral Low  Shoulder Row with Anchored Resistance  - 1 x daily - 7 x weekly - 1-3 sets - 10 reps - 2-3 sec  hold - Shoulder extension with resistance - Neutral  - 1 x daily - 7 x weekly - 1-2 sets - 10 reps - 3-5 sec  hold - Anti-Rotation Lateral Stepping with Press  - 1 x daily - 7 x weekly - 1-2 sets - 10 reps - 2-3 sec  hold - Eccentric Hip Flexor Strengthening (Mirrored)  - 1 x daily - 7 x weekly - 2 sets - 10 reps - Plank with  Elbows on Table  - 1 x daily - 3 x weekly - 1 sets - 5 reps - max hold - Supine Hip Abduction  - 1 x daily - 3 x weekly - 2 sets - 10 reps - Sidelying Hip Flexion  - 1 x daily - 3 x weekly - 2 sets - 10 reps  ASSESSMENT:  CLINICAL IMPRESSION: Axelle continues to demonstrate marked hip flexor, ABDuctor and extension weakness. Progressed flex and ABD with gravity eliminated exercises today. She was too fatigued to complete eccentric hip flexor exercise immediately after. She came in with some discomfort and a knot in her back, but this resolved by end of session.    EVAL: Patient is a 53 y.o. female who was seen today for physical therapy evaluation and treatment for recurrent LBP. E experienced initial symptoms following hysterectomy 5/24 with symptoms resolving in a few weeks. She noticed pain in the same area of LB ~ mid March which have persisted. She has also experienced edema in bilat LE's. Symptoms were better when she was on vacation for the past two weeks but recurred when she started back to work yesterday and have increased today. She has poor posture and alignment with forward flexed trunk. Patient has tightness through the hip flexors, R > L; weakness bilat LE's R > L; pain and tightness with PA mobs lumbar spine and tightness in the R > L lumbar paraspinals. She sits with weight shifted to one hip and has difficulty sitting with equal weight bearing bilat hips. She has sitting job and sedentary lifestyle sitting ina recliner to rest when home. Patient will benefit from PT to address problems identified.    GOALS: Goals reviewed with patient? Yes  SHORT TERM GOALS: Target date: 08/20/2023   Independent in initial HEP  Baseline: Goal status: INITIAL  2.  Patient demonstrates and verbalizes correct posture and alignment in sitting  Baseline:  Goal status: INITIAL  3.  Patient reports decreased pain by 25-50%  Baseline:  Goal status: INITIAL   LONG TERM GOALS: Target date:  09/17/2023   Decrease pain by 75-100% allowing patient to return to normal functional and work activities  Baseline:  Goal status: INITIAL  2.  Patient reports ability to work with no more than 1-3/10 pain level  Baseline:  Goal status: INITIAL  3.  Increased lumbar ROM to [facility] and pain free  Baseline:  Goal status: INITIAL  4.  Increase strength bilat LE's to 4+/5 to 5/5  Baseline:  Goal status: INITIAL  5.  Independent in HEP including aquatic program as indicated  Baseline:  Goal status: INITIAL  6.  Improve Owestry score by 10-20 points  Baseline: 11/50; 22%  Goal status: INITIAL  PLAN:  PT FREQUENCY: 2x/week  PT DURATION: 8 weeks  PLANNED INTERVENTIONS: 97110-Therapeutic exercises, 97530- Therapeutic activity, [id number]- Neuromuscular re-education, 97535- Self Care, [id number]- Manual therapy, [id number]- Aquatic Therapy,  Patient/Family education, Balance training, Stair training, Taping, Dry Needling, and Joint mobilization.  PLAN FOR NEXT SESSION: continue to focus on  R hip flexor strength and core strength, continue with TPR and mobs prn.  Jinx Mourning, PT  08/13/23 12:34 PM

## 2023-08-13 ENCOUNTER — Encounter: Payer: Self-pay | Admitting: Physical Therapy

## 2023-08-13 ENCOUNTER — Ambulatory Visit: Admitting: Physical Therapy

## 2023-08-13 DIAGNOSIS — M6281 Muscle weakness (generalized): Secondary | ICD-10-CM

## 2023-08-13 DIAGNOSIS — R29898 Other symptoms and signs involving the musculoskeletal system: Secondary | ICD-10-CM

## 2023-08-13 DIAGNOSIS — M5459 Other low back pain: Secondary | ICD-10-CM | POA: Diagnosis not present

## 2023-08-15 ENCOUNTER — Ambulatory Visit: Admitting: Rehabilitative and Restorative Service Providers"

## 2023-08-15 ENCOUNTER — Encounter: Payer: Self-pay | Admitting: Rehabilitative and Restorative Service Providers"

## 2023-08-15 DIAGNOSIS — R29898 Other symptoms and signs involving the musculoskeletal system: Secondary | ICD-10-CM

## 2023-08-15 DIAGNOSIS — M6281 Muscle weakness (generalized): Secondary | ICD-10-CM

## 2023-08-15 DIAGNOSIS — M5459 Other low back pain: Secondary | ICD-10-CM

## 2023-08-15 NOTE — Therapy (Signed)
 OUTPATIENT PHYSICAL THERAPY THORACOLUMBAR TREATMENT   Patient Name: Michele Roman MRN: [id number] DOB:[birth year], 53 y.o., female Today's Date: 08/15/2023  END OF SESSION:  PT End of Session - 08/15/23 1145     Visit Number 7    Number of Visits 16    Date for PT Re-Evaluation 09/17/23    Authorization Type aetna copay none    Authorization Time Period year    Authorization - Visit Number 7    Authorization - Number of Visits 40    PT Start Time 1145    PT Stop Time 1230    PT Time Calculation (min) 45 min    Activity Tolerance Patient tolerated treatment well                 Past Medical History:  Diagnosis Date   Anemia    Endometrial polyp 02/22/2020   Family history of colon cancer 06/10/2018   Father age 48   H/O colonoscopy 03/01/2009   Menorrhagia with regular cycle 02/22/2020   Treated with endometrial ablation January 2022   Past Surgical History:  Procedure Laterality Date   APPENDECTOMY  1997   CERVICAL POLYPECTOMY  04/2020   CESAREAN SECTION  1999   COLONOSCOPY  2012   normal per pt   DILATION AND CURETTAGE, DIAGNOSTIC / THERAPEUTIC  04/2020   EXPLORATORY LAPAROTOMY  1999   Right foot surgery  2008, 2011   hardware in the foot   UTERINE ABLATION  04/2020   Patient Active Problem List   Diagnosis Date Noted   S/P laparoscopic hysterectomy 08/16/2022   Iron  deficiency anemia due to chronic blood loss 03/04/2020   Morbid obesity (HCC) 02/22/2020   Family history of colon cancer 06/10/2018   Anemia, iron  deficiency 06/25/2013   Fatty liver 04/15/2009    PCP: Cherre Cornish, NP  REFERRING PROVIDER: Cherre Cornish, NP  REFERRING DIAG: Chronic midline LBP  Rationale for Evaluation and Treatment: Rehabilitation  THERAPY DIAG:  Other low back pain  Other symptoms and signs involving the musculoskeletal system  Muscle weakness (generalized)  ONSET DATE: 06/15/23  SUBJECTIVE:                                                                                                                                                                                            SUBJECTIVE STATEMENT: Awoke with some pain and tightness in the R hip. Loosened up now. New chair ordered Monday.  EVAL:  Patient reports that she noticed LBP the day after hysterectomy last year. She treated pain with heat which was intermittent. She noticed pain and spider like appearance in the R  LB area over the past 5 weeks or so. She had xrays which showed some degenerative changes. Feels the symptoms are associated with sitting in chair at work. She was on vacation for the past two weeks and had no discomfort. She returned to work yesterday and pain started again and is worse today. 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 Yes: NPRS scale: 0/10 Pain location: center of LB Pain description: knot Aggravating factors: sitting at work  Relieving factors: not working and she is OK sitting at home; recliner   PRECAUTIONS: None   WEIGHT BEARING RESTRICTIONS: No  FALLS:  Has patient fallen in last 6 months? 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  DIAGNOSTIC FINDINGS:  Xray - 07/02/23: Leftward curvature lumbar spine. Preservation of the vertebral body heights. Degenerative disc disease most pronounced L1-2 and  L5-S1. Stool throughout the colon. SI joints are unremarkable.   IMPRESSION: Degenerative disc disease most pronounced L1-2 and L5-S1.  PATIENT SURVEYS:  Oswestry 11/50; 22%     SENSATION: WFL  MUSCLE LENGTH: Hamstrings: Right 75 deg; Left 75 deg Thomas test: Right -10-20 deg; Left -10-20 deg  POSTURE: rounded shoulders, forward head, flexed trunk , and LE's in ER   PALPATION: Tightness bilat hip flexors L > R; lumbar paraspinals R > L  Pain with PA mobs sacrum; lower lumbar    LUMBAR ROM:   AROM eval  Flexion 30% pain   Extension 20% pain   Right lateral flexion 60% pull L  Left lateral flexion 55% pull R   Right rotation 20%   Left rotation 20%   (Blank rows = not tested)  LOWER EXTREMITY ROM:     Active  Right eval Left eval  Hip flexion Tight  Tight   Hip extension Tight  Tight   Hip abduction    Hip adduction    Hip internal rotation Tight   Hip external rotation    Knee flexion    Knee extension    Ankle dorsiflexion    Ankle plantarflexion    Ankle inversion    Ankle eversion     (Blank rows = not tested)  LOWER EXTREMITY MMT:    MMT Right eval Left eval  Hip flexion 4- 4  Hip extension 4- 4  Hip abduction 4- 4  Hip adduction    Hip internal rotation    Hip external rotation    Knee flexion 5 5  Knee extension 5 5  Ankle dorsiflexion    Ankle plantarflexion    Ankle inversion    Ankle eversion     (Blank rows = not tested)  LUMBAR SPECIAL TESTS:  Straight leg raise test: Negative and Slump test: Negative  FUNCTIONAL TESTS:  5 times sit to stand: 20.81 use of UE's "tension in the center of LB'  GAIT: Distance walked: 40  feet Assistive device utilized: None Level of assistance: Complete Independence Comments: LE's in ER; trunk flexed forward at hips . TREATMENT DATE: 08/15/23 Therapeutic exercises: Nustep L5 x 5 min  Single leg hip flexion step up on 6 inch step  x 20 B with one UE support Modified mountain climbers at  counter x 10 ea does not feel a lot in core Standing hip extension to flexion on slider x 10 B - poles for UE support Standing hip ABD/ADD on slider x 10 B - poles for UE support S/L R/L hip flexion (SLR) 2x 10 S/L R/L hip ABD PT assist for positioning for glut med Supine hip ABD to fatigue Supine SLR to ~ 8 inch 3 sec hold R x 5; L x 10  Therapeutic activities:  Row blue TB 3 sec x 10 x 2  Shoulder extension blue TB 3 sec x 10 x 2  Standing shoulder flexion blue in split stance 2 x 10 Pallof push pull from midline two blue with pole 3 sec x 2 x 10 R/L  Split squat back foot on 12 inch step 3 sec x 10 R/L  Bridge 5 sec hold 2x10  Neuromuscular Re-education: Plank at mat table max hold x 5 reps  PPT with SLR too difficult, sequential march too difficult on R   TREATMENT DATE: 08/13/23 Therapeutic exercises: single leg hip flexion step up on 6 inch step  x 20 B with one UE support Modified mountain climbers at counter x 10 ea does not feel a lot in core Standing hip extension to flexion on slider x 10 B - poles for UE support Standing hip ABD/ADD on slider x 10 B - poles for UE support R hip flexor eccentric strengthening in [place] test position - unable to do - fatigued from exercises below S/L R hip flexion (SLR) 2x 10 S/L R hip ABD too difficult Supine hip ABD to fatigue   Therapeutic activities:  Standing shoulder flexion blue in split stance 2 x 10 Pallof push pull from midline two blue with pole 3 sec R/L  Bridge 5 sec hold 2x10 (attempted bridge with knee ext but too hard)   Neuromuscular Re-education: Plank at mat table max hold x 3 reps PPT with SLR too difficult, sequential march too difficult on R   TREATMENT DATE: 08/08/23 Therapeutic exercises: Attempted single leg hip flexion with green band resistance and stepping up on 8 inch step but too challenging, so went to no resistance from floor x 20 B with B UE support on poles Modified mountain climbers at counter x  10 ea Standing hip extension to flexion on slider x 10 B - poles for UE support Standing hip ABD/ADD on slider x 10 B - poles for UE support R hip flexor eccentric strengthening in Thomas test position x 8 R, x 10 L, then R x 3 pt fatigues - cues for slow pace and full range to mat table  Therapeutic activities:  Standing shoulder flexion blue + red for core x 20 Pallof push pull from midline two blue with pole 3 sec R/L    TREATMENT DATE: 08/06/23 Therapeutic exercises: Seated figure 4 stretch 30 sec x 3 R/L  Seated hip flexor stretch 3x30 sec R/L R hip flexor eccentric strengthening in Thomas test position x 8  Therapeutic activities:  Hooklying shoulder flexion blue TB between hands 3 sec x 10  Hooklying bridge 5 sec x 10  Pallof push pull from midline one strip blue and  one green TB with pole 3 sec x 10 R/L   Trigger Point Dry Needling  Initial Treatment: Pt instructed on Dry Needling rational, procedures, and possible side effects. Pt instructed to expect mild to moderate muscle soreness later in the day and/or into the next day.  Pt instructed in methods to reduce muscle soreness. Pt instructed to continue prescribed HEP. Patient was educated on signs and symptoms of infection and other risk factors and advised to seek medical attention should they occur.  Patient verbalized understanding of these instructions and education.   Patient Verbal Consent Given: Yes Education Handout Provided: Yes Muscles Treated: L4/5 Lumbar multifidi Electrical Stimulation Performed: No Treatment Response/Outcome: Utilized skilled palpation to identify bony landmarks and trigger points.  Unable to illicit twitch response.   Manual: UPA mobs to L4 and L5, TPR to R psoas with active hip flexion x 10    PATIENT EDUCATION:  Education details: HEP Update - pt using App now Person educated: Patient Education method: Explanation, Demonstration, Tactile cues, Verbal cues, and Handouts Education  comprehension: verbalized understanding, returned demonstration, verbal cues required, tactile cues required, and needs further education  HOME EXERCISE PROGRAM: Access Code: [email or web address] URL: [email or web address] Date: 08/13/2023 Prepared by: Concha Deed  Exercises - Prone Press Up  - 2 x daily - 7 x weekly - 1 sets - 10 reps - 2-3 sec  hold - Prone Press Up On Elbows  - 2 x daily - 7 x weekly - 1 sets - 3 reps - 30 sec  hold - Standing Back Extension  - 2 x daily - 7 x weekly - 1 sets - 3 reps - 2-3 sec  hold - Seated Hip Flexor Stretch  - 2 x daily - 7 x weekly - 1 sets - 3 reps - 30 sec  hold - Standing Piriformis Release with Ball at [employer]  - 2 x daily - 7 x weekly - 30-60 sec  hold - Prone Hip Extension  - 1 x daily - 3 x weekly - 2 sets - 10 reps - Seated Piriformis Stretch with Trunk Bend  - 2 x daily - 7 x weekly - 1 sets - 3 reps - 30-60 sec  hold - Supine Shoulder Flexion Extension AAROM with [place]el  - 1 x daily - 7 x weekly - 1-2 sets - 10 reps - 3 sec  hold - Beginner Bridge  - 1 x daily - 7 x weekly - 1 sets - 10 reps - 5 sec  hold - Standing Bilateral Low Shoulder Row with Anchored Resistance  - 1 x daily - 7 x weekly - 1-3 sets - 10 reps - 2-3 sec  hold - Shoulder extension with resistance - Neutral  - 1 x daily - 7 x weekly - 1-2 sets - 10 reps - 3-5 sec  hold - Anti-Rotation Lateral Stepping with Press  - 1 x daily - 7 x weekly - 1-2 sets - 10 reps - 2-3 sec  hold - Eccentric Hip Flexor Strengthening (Mirrored)  - 1 x daily - 7 x weekly - 2 sets - 10 reps - Plank with Elbows on Table  - 1 x daily - 3 x weekly - 1 sets - 5 reps - max hold - Supine Hip Abduction  - 1 x daily - 3 x weekly - 2 sets - 10 reps - Sidelying Hip Flexion  - 1 x daily - 3 x weekly - 2 sets - 10 reps  ASSESSMENT:  CLINICAL IMPRESSION:  Macaila reports that the hip and back are gradually improving. Continued weakness through hip flexor, ABDuctor and extension. Treatment consisted of strengthening  and stabilization core and LE's.    EVAL: Patient is a 53 y.o. female who was seen today for physical therapy evaluation and treatment for recurrent LBP. E experienced initial symptoms following hysterectomy 5/24 with symptoms resolving in a few weeks. She noticed pain in the same area of LB ~ mid March which have persisted. She has also experienced edema in bilat LE's. Symptoms were better when she was on vacation for the past two weeks but recurred when she started back to work yesterday and have increased today. She has poor posture and alignment with forward flexed trunk. Patient has tightness through the hip flexors, R > L; weakness bilat LE's R > L; pain and tightness with PA mobs lumbar spine and tightness in the R > L lumbar paraspinals. She sits with weight shifted to one hip and has difficulty sitting with equal weight bearing bilat hips. She has sitting job and sedentary lifestyle sitting in a recliner to rest when home. Patient will benefit from PT to address problems identified.    GOALS: Goals reviewed with patient? Yes  SHORT TERM GOALS: Target date: 08/20/2023   Independent in initial HEP  Baseline: Goal status: INITIAL  2.  Patient demonstrates and verbalizes correct posture and alignment in sitting  Baseline:  Goal status: INITIAL  3.  Patient reports decreased pain by 25-50%  Baseline:  Goal status: INITIAL   LONG TERM GOALS: Target date: 09/17/2023   Decrease pain by 75-100% allowing patient to return to normal functional and work activities  Baseline:  Goal status: INITIAL  2.  Patient reports ability to work with no more than 1-3/10 pain level  Baseline:  Goal status: INITIAL  3.  Increased lumbar ROM to [facility] and pain free  Baseline:  Goal status: INITIAL  4.  Increase strength bilat LE's to 4+/5 to 5/5  Baseline:  Goal status: INITIAL  5.  Independent in HEP including aquatic program as indicated  Baseline:  Goal status: INITIAL  6.  Improve  Owestry score by 10-20 points  Baseline: 11/50; 22%  Goal status: INITIAL  PLAN:  PT FREQUENCY: 2x/week  PT DURATION: 8 weeks  PLANNED INTERVENTIONS: 97110-Therapeutic exercises, 97530- Therapeutic activity, 97112- Neuromuscular re-education, 97535- Self Care, [id number]- Manual therapy, [phone]- Aquatic Therapy, Patient/Family education, Balance training, Stair training, Taping, Dry Needling, and Joint mobilization.  PLAN FOR NEXT SESSION: continue to focus on  R hip flexor strength and core strength, continue with TPR and mobs prn.  Shyloh Derosa P. Geraldo Klippel PT, MPH 08/15/23 11:46 AM

## 2023-08-20 ENCOUNTER — Encounter: Payer: Self-pay | Admitting: Physical Therapy

## 2023-08-20 ENCOUNTER — Ambulatory Visit: Payer: Self-pay | Admitting: Physical Therapy

## 2023-08-20 DIAGNOSIS — M6281 Muscle weakness (generalized): Secondary | ICD-10-CM

## 2023-08-20 DIAGNOSIS — M5459 Other low back pain: Secondary | ICD-10-CM | POA: Diagnosis not present

## 2023-08-20 DIAGNOSIS — R29898 Other symptoms and signs involving the musculoskeletal system: Secondary | ICD-10-CM

## 2023-08-20 NOTE — Therapy (Signed)
 OUTPATIENT PHYSICAL THERAPY THORACOLUMBAR TREATMENT   Patient Name: Michele Roman MRN: [id number] DOB:1970-09-13, 53 y.o., female Today's Date: 08/20/2023  END OF SESSION:  PT End of Session - 08/20/23 1145     Visit Number 8    Number of Visits 16    Date for PT Re-Evaluation 09/17/23    Authorization Type aetna copay none    Authorization Time Period year    Authorization - Visit Number 8    Authorization - Number of Visits 40    PT Start Time 1143    PT Stop Time 1225    PT Time Calculation (min) 42 min    Activity Tolerance Patient tolerated treatment well    Behavior During Therapy [facility] for tasks assessed/performed                  Past Medical History:  Diagnosis Date   Anemia    Endometrial polyp 02/22/2020   Family history of colon cancer 06/10/2018   Father age 9   H/O colonoscopy 03/01/2009   Menorrhagia with regular cycle 02/22/2020   Treated with endometrial ablation January 2022   Past Surgical History:  Procedure Laterality Date   APPENDECTOMY  1997   CERVICAL POLYPECTOMY  04/2020   CESAREAN SECTION  1999   COLONOSCOPY  2012   normal per pt   DILATION AND CURETTAGE, DIAGNOSTIC / THERAPEUTIC  04/2020   EXPLORATORY LAPAROTOMY  1999   Right foot surgery  2008, 2011   hardware in the foot   UTERINE ABLATION  04/2020   Patient Active Problem List   Diagnosis Date Noted   S/P laparoscopic hysterectomy 08/16/2022   Iron  deficiency anemia due to chronic blood loss 03/04/2020   Morbid obesity (HCC) 02/22/2020   Family history of colon cancer 06/10/2018   Anemia, iron  deficiency 06/25/2013   Fatty liver 04/15/2009    PCP: Cherre Cornish, NP  REFERRING PROVIDER: Cherre Cornish, NP  REFERRING DIAG: Chronic midline LBP  Rationale for Evaluation and Treatment: Rehabilitation  THERAPY DIAG:  Other low back pain  Other symptoms and signs involving the musculoskeletal system  Muscle weakness (generalized)  ONSET DATE: 06/15/23  SUBJECTIVE:                                                                                                                                                                                            SUBJECTIVE STATEMENT: No pain today. New bike arrived, but haven't put it together.   EVAL:  Patient reports that she noticed LBP the day after hysterectomy last year. She treated pain with heat which was intermittent. She noticed pain and  spider like appearance in the R LB area over the past 5 weeks or so. She had xrays which showed some degenerative changes. Feels the symptoms are associated with sitting in chair at work. She was on vacation for the past two weeks and had no discomfort. She returned to work yesterday and pain started again and is worse today. 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 Yes: NPRS scale: 0/10 Pain location: center of LB Pain description: knot Aggravating factors: sitting at work  Relieving factors: not working and she is OK sitting at home; recliner   PRECAUTIONS: None   WEIGHT BEARING RESTRICTIONS: No  FALLS:  Has patient fallen in last 6 months? 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  DIAGNOSTIC FINDINGS:  Xray - 07/02/23: Leftward curvature lumbar spine. Preservation of the vertebral body heights. Degenerative disc  disease most pronounced L1-2 and L5-S1. Stool throughout the colon. SI joints are unremarkable.   IMPRESSION: Degenerative disc disease most pronounced L1-2 and L5-S1.  PATIENT SURVEYS:  Oswestry 11/50; 22%     SENSATION: WFL  MUSCLE LENGTH: Hamstrings: Right 75 deg; Left 75 deg Thomas test: Right -10-20 deg; Left -10-20 deg  POSTURE: rounded shoulders, forward head, flexed trunk , and LE's in ER   PALPATION: Tightness bilat hip flexors L > R; lumbar paraspinals R > L  Pain with PA mobs sacrum; lower lumbar    LUMBAR ROM:   AROM eval  Flexion 30% pain   Extension 20% pain   Right lateral flexion 60% pull L  Left lateral flexion 55% pull R   Right rotation 20%   Left rotation 20%   (Blank rows = not tested)  LOWER EXTREMITY ROM:     Active  Right eval Left eval  Hip flexion Tight  Tight   Hip extension Tight  Tight   Hip abduction    Hip adduction    Hip internal rotation Tight   Hip external rotation    Knee flexion    Knee extension    Ankle dorsiflexion    Ankle plantarflexion    Ankle inversion    Ankle eversion     (Blank rows = not tested)  LOWER EXTREMITY MMT:    MMT Right eval Left eval  Hip flexion 4- 4  Hip extension 4- 4  Hip abduction 4- 4  Hip adduction    Hip internal rotation    Hip external rotation    Knee flexion 5 5  Knee extension 5 5  Ankle dorsiflexion    Ankle plantarflexion    Ankle inversion    Ankle eversion     (Blank rows = not tested)  LUMBAR SPECIAL TESTS:  Straight leg raise test: Negative and Slump test: Negative  FUNCTIONAL TESTS:  5 times sit to stand: 20.81 use of UE's "tension in the center of  LB'  GAIT: Distance walked: 40 feet Assistive device utilized: None Level of assistance: Complete Independence Comments: LE's in ER; trunk flexed forward at hips . TREATMENT DATE: 08/20/23 Therapeutic exercises: Nustep L6 x 5 min  Single leg hip flexion step up on 6 inch step  x 20 B with one UE support S/L  R/L hip flexion (SLR) 2x 10 Supine hip ABD x 20 B  Supine SLR R:  1-2 inch 3 sec hold  reps to fatigue  L: 8 inch 5 sec hold x 10 S/L R/L hip ABD PT assist for positioning for glut med 5 sec hold 2 x 10 Standing hip extension at 45 deg at stairs x 20 B  Neuromuscular Re-education: Plank at mat table 30 sec hold x2  Therapeutic activities:  Bridge 5 sec hold 2x10     TREATMENT DATE: 08/15/23 Therapeutic exercises: Nustep L5 x 5 min  Single leg hip flexion step up on 6 inch step  x 20 B with one UE support Modified mountain climbers at counter x 10 ea does not feel a lot in core Standing hip extension to flexion on slider x 10 B - poles for UE support Standing hip ABD/ADD on slider x 10 B - poles for UE support S/L R/L hip flexion (SLR) 2x 10 S/L R/L hip ABD PT assist for positioning for glut med Supine hip ABD to fatigue Supine SLR to ~ 8 inch 3 sec hold R x 5; L x 10  Therapeutic activities:  Row blue TB 3 sec x 10 x 2  Shoulder extension blue TB 3 sec x 10 x 2  Standing shoulder flexion blue in split stance 2 x 10 Pallof push pull from midline two blue with pole 3 sec x 2 x 10 R/L  Split squat back foot on 12 inch step 3 sec x 10 R/L  Bridge 5 sec hold 2x10  Neuromuscular Re-education: Plank at mat table max hold x 5 reps PPT with SLR too difficult, sequential march too difficult on R   TREATMENT DATE: 08/13/23 Therapeutic exercises: single leg hip flexion step up on 6 inch step  x 20 B with one UE support Modified mountain climbers at counter x 10 ea does not feel a lot in core Standing hip extension to flexion on slider x 10 B - poles for UE support Standing hip ABD/ADD on slider x 10 B - poles for UE support R hip flexor eccentric strengthening in [place] test position - unable to do - fatigued from exercises below S/L R hip flexion (SLR) 2x 10 S/L R hip ABD too difficult Supine hip ABD to fatigue   Therapeutic activities:  Standing shoulder flexion blue in split  stance 2 x 10 Pallof push pull from midline two blue with pole 3 sec R/L  Bridge 5 sec hold 2x10 (attempted bridge with knee ext but too hard)   Neuromuscular Re-education: Plank at mat table max hold x 3 reps PPT with SLR too difficult, sequential march too difficult on R   TREATMENT DATE: 08/08/23 Therapeutic exercises: Attempted single leg hip flexion with green band resistance and stepping up on 8 inch step but too challenging, so went to no resistance from floor x 20 B with B UE support on poles Modified mountain climbers at counter x 10 ea Standing hip extension to flexion on slider x 10 B - poles for UE support Standing hip ABD/ADD on slider x 10 B - poles for UE support R hip flexor eccentric  strengthening in [place] test position x 8 R, x 10 L, then R x 3 pt fatigues - cues for slow pace and full range to mat table  Therapeutic activities:  Standing shoulder flexion blue + red for core x 20 Pallof push pull from midline two blue with pole 3 sec R/L    TREATMENT DATE: 08/06/23 Therapeutic exercises: Seated figure 4 stretch 30 sec x 3 R/L  Seated hip flexor stretch 3x30 sec R/L R hip flexor eccentric strengthening in Thomas test position x 8  Therapeutic activities:  Hooklying shoulder flexion blue TB between hands 3 sec x 10  Hooklying bridge 5 sec x 10  Pallof push pull from midline one strip blue and one green TB with pole 3 sec x 10 R/L   Trigger Point Dry Needling  Initial Treatment: Pt instructed on Dry Needling rational, procedures, and possible side effects. Pt instructed to expect mild to moderate muscle soreness later in the day and/or into the next day.  Pt instructed in methods to reduce muscle soreness. Pt instructed to continue prescribed HEP. Patient was educated on signs and symptoms of infection and other risk factors and advised to seek medical attention should they occur.  Patient verbalized understanding of these instructions and education.   Patient  Verbal Consent Given: Yes Education Handout Provided: Yes Muscles Treated: L4/5 Lumbar multifidi Electrical Stimulation Performed: No Treatment Response/Outcome: Utilized skilled palpation to identify bony landmarks and trigger points.  Unable to illicit twitch response.   Manual: UPA mobs to L4 and L5, TPR to R psoas with active hip flexion x 10    PATIENT EDUCATION:  Education details: HEP Update - pt using App now Person educated: Patient Education method: Explanation, Demonstration, Tactile cues, Verbal cues, and Handouts Education comprehension: verbalized understanding, returned demonstration, verbal cues required, tactile cues required, and needs further education  HOME EXERCISE PROGRAM: Access Code: [email or web address] URL: [email or web address] Date: 08/13/2023 Prepared by: Concha Deed  Exercises - Prone Press Up  - 2 x daily - 7 x weekly - 1 sets - 10 reps - 2-3 sec  hold - Prone Press Up On Elbows  - 2 x daily - 7 x weekly - 1 sets - 3 reps - 30 sec  hold - Standing Back Extension  - 2 x daily - 7 x weekly - 1 sets - 3 reps - 2-3 sec  hold - Seated Hip Flexor Stretch  - 2 x daily - 7 x weekly - 1 sets - 3 reps - 30 sec  hold - Standing Piriformis Release with Ball at [employer]  - 2 x daily - 7 x weekly - 30-60 sec  hold - Prone Hip Extension  - 1 x daily - 3 x weekly - 2 sets - 10 reps - Seated Piriformis Stretch with Trunk Bend  - 2 x daily - 7 x weekly - 1 sets - 3 reps - 30-60 sec  hold - Supine Shoulder Flexion Extension AAROM with [place]el  - 1 x daily - 7 x weekly - 1-2 sets - 10 reps - 3 sec  hold - Beginner Bridge  - 1 x daily - 7 x weekly - 1 sets - 10 reps - 5 sec  hold - Standing Bilateral Low Shoulder Row with Anchored Resistance  - 1 x daily - 7 x weekly - 1-3 sets - 10 reps - 2-3 sec  hold - Shoulder extension with resistance - Neutral  - 1 x daily - 7 x weekly - 1-2 sets -  10 reps - 3-5 sec  hold - Anti-Rotation Lateral Stepping with Press  - 1 x daily - 7 x weekly -  1-2 sets - 10 reps - 2-3 sec  hold - Eccentric Hip Flexor Strengthening (Mirrored)  - 1 x daily - 7 x weekly - 2 sets - 10 reps - Plank with Elbows on Table  - 1 x daily - 3 x weekly - 1 sets - 5 reps - max hold - Supine Hip Abduction  - 1 x daily - 3 x weekly - 2 sets - 10 reps - Sidelying Hip Flexion  - 1 x daily - 3 x weekly - 2 sets - 10 reps  ASSESSMENT:  CLINICAL IMPRESSION: Patient has met 2/3 STG. Continued focus on B hip strengthening. R glute med and psoas are very weak and is extremely challenged by anti-gravity exercises. She reports no back pain x 1 week and no "knot" in her back. She is independent with her HEP, but not fully compliant with frequency. She continues to demonstrate potential for improvement and would benefit from continued skilled therapy to address impairments.     EVAL: Patient is a 53 y.o. female who was seen today for physical therapy evaluation and treatment for recurrent LBP. E experienced initial symptoms following hysterectomy 5/24 with symptoms resolving in a few weeks. She noticed pain in the same area of LB ~ mid March which have persisted. She has also experienced edema in bilat LE's. Symptoms were better when she was on vacation for the past two weeks but recurred when she started back to work yesterday and have increased today. She has poor posture and alignment with forward flexed trunk. Patient has tightness through the hip flexors, R > L; weakness bilat LE's R > L; pain and tightness with PA mobs lumbar spine and tightness in the R > L lumbar paraspinals. She sits with weight shifted to one hip and has difficulty sitting with equal weight bearing bilat hips. She has sitting job and sedentary lifestyle sitting in a recliner to rest when home. Patient will benefit from PT to address problems identified.    GOALS: Goals reviewed with patient? Yes  SHORT TERM GOALS: Target date: 08/20/2023   Independent in initial HEP  Baseline: Goal status: MET  2.   Patient demonstrates and verbalizes correct posture and alignment in sitting  Baseline:  Goal status: IN PROGRESS  3.  Patient reports decreased pain by 25-50%  Baseline:  Goal status: MET   LONG TERM GOALS: Target date: 09/17/2023   Decrease pain by 75-100% allowing patient to return to normal functional and work activities  Baseline:  Goal status: INITIAL  2.  Patient reports ability to work with no more than 1-3/10 pain level  Baseline:  Goal status: INITIAL  3.  Increased lumbar ROM to [facility] and pain free  Baseline:  Goal status: INITIAL  4.  Increase strength bilat LE's to 4+/5 to 5/5  Baseline:  Goal status: INITIAL  5.  Independent in HEP including aquatic program as indicated  Baseline:  Goal status: INITIAL  6.  Improve Owestry score by 10-20 points  Baseline: 11/50; 22%  Goal status: INITIAL  PLAN:  PT FREQUENCY: 2x/week  PT DURATION: 8 weeks  PLANNED INTERVENTIONS: 97110-Therapeutic exercises, 97530- Therapeutic activity, 97112- Neuromuscular re-education, 97535- Self Care, [id number]- Manual therapy, [phone]- Aquatic Therapy, Patient/Family education, Balance training, Stair training, Taping, Dry Needling, and Joint mobilization.  PLAN FOR NEXT SESSION: continue to focus on  R hip  flexor strength and core strength, continue with TPR and mobs prn.  Jinx Mourning, PT  08/20/23 12:34 PM

## 2023-08-21 NOTE — Therapy (Signed)
 OUTPATIENT PHYSICAL THERAPY THORACOLUMBAR TREATMENT   Patient Name: Michele Roman MRN: [id number] DOB:04-10-1970, 53 y.o., female Today's Date: 08/22/2023  END OF SESSION:  PT End of Session - 08/22/23 0757     Visit Number 9    Number of Visits 16    Date for PT Re-Evaluation 09/17/23    Authorization Type aetna copay none    Authorization Time Period year    Authorization - Visit Number 9    Authorization - Number of Visits 40    PT Start Time 0800    PT Stop Time 0845    PT Time Calculation (min) 45 min    Activity Tolerance Patient tolerated treatment well    Behavior During Therapy [facility] for tasks assessed/performed                   Past Medical History:  Diagnosis Date   Anemia    Endometrial polyp 02/22/2020   Family history of colon cancer 06/10/2018   Father age 69   H/O colonoscopy 03/01/2009   Menorrhagia with regular cycle 02/22/2020   Treated with endometrial ablation January 2022   Past Surgical History:  Procedure Laterality Date   APPENDECTOMY  1997   CERVICAL POLYPECTOMY  04/2020   CESAREAN SECTION  1999   COLONOSCOPY  2012   normal per pt   DILATION AND CURETTAGE, DIAGNOSTIC / THERAPEUTIC  04/2020   EXPLORATORY LAPAROTOMY  1999   Right foot surgery  2008, 2011   hardware in the foot   UTERINE ABLATION  04/2020   Patient Active Problem List   Diagnosis Date Noted   S/P laparoscopic hysterectomy 08/16/2022   Iron  deficiency anemia due to chronic blood loss 03/04/2020   Morbid obesity (HCC) 02/22/2020   Family history of colon cancer 06/10/2018   Anemia, iron  deficiency 06/25/2013   Fatty liver 04/15/2009    PCP: Cherre Cornish, NP  REFERRING PROVIDER: Cherre Cornish, NP  REFERRING DIAG: Chronic midline LBP  Rationale for Evaluation and Treatment: Rehabilitation  THERAPY DIAG:  Other low back pain  Other symptoms and signs involving the musculoskeletal system  Muscle weakness (generalized)  ONSET DATE: 06/15/23  SUBJECTIVE:                                                                                                                                                                                            SUBJECTIVE STATEMENT: No pain today. The swelling is gone out of my ankles, but is still in my knees and thighs.  EVAL:  Patient reports that she noticed LBP the day after hysterectomy last year. She treated pain with  heat which was intermittent. She noticed pain and spider like appearance in the R LB area over the past 5 weeks or so. She had xrays which showed some degenerative changes. Feels the symptoms are associated with sitting in chair at work. She was on vacation for the past two weeks and had no discomfort. She returned to work yesterday and pain started again and is worse today. 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 Yes: NPRS scale: 0/10 Pain location: center of LB Pain description: knot Aggravating factors: sitting at work  Relieving factors: not working and she is OK sitting at home; recliner   PRECAUTIONS: None   WEIGHT BEARING RESTRICTIONS: No  FALLS:  Has patient fallen in last 6 months? 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  DIAGNOSTIC FINDINGS:  Xray - 07/02/23: Leftward curvature lumbar spine. Preservation of the vertebral  body heights. Degenerative disc disease most pronounced L1-2 and L5-S1. Stool throughout the colon. SI joints are unremarkable.   IMPRESSION: Degenerative disc disease most pronounced L1-2 and L5-S1.  PATIENT SURVEYS:  Oswestry 11/50; 22%     SENSATION: WFL  MUSCLE LENGTH: Hamstrings: Right 75 deg; Left 75 deg Thomas test: Right -10-20 deg; Left -10-20 deg  POSTURE: rounded shoulders, forward head, flexed trunk , and LE's in ER   PALPATION: Tightness bilat hip flexors L > R; lumbar paraspinals R > L  Pain with PA mobs sacrum; lower lumbar    LUMBAR ROM:   AROM eval  Flexion 30% pain   Extension 20% pain   Right lateral flexion 60% pull L  Left lateral flexion 55% pull R   Right rotation 20%   Left rotation 20%   (Blank rows = not tested)  LOWER EXTREMITY ROM:     Active  Right eval Left eval  Hip flexion Tight  Tight   Hip extension Tight  Tight   Hip abduction    Hip adduction    Hip internal rotation Tight   Hip external rotation    Knee flexion    Knee extension    Ankle dorsiflexion    Ankle plantarflexion    Ankle inversion    Ankle eversion     (Blank rows = not tested)  LOWER EXTREMITY MMT:    MMT Right eval Left eval Right 5/22 Left 5/22  Hip flexion 4- 4 4+ 5  Hip extension 4- 4 5 5   Hip abduction 4- 4 4+ 5  Hip adduction   4+ 4+  Hip internal rotation      Hip external rotation      Knee flexion 5 5    Knee extension 5 5    Ankle dorsiflexion      Ankle plantarflexion      Ankle inversion      Ankle eversion       (Blank  rows = not tested)  LUMBAR SPECIAL TESTS:  Straight leg raise test: Negative and Slump test: Negative  FUNCTIONAL TESTS:  5 times sit to stand: 20.81 use of UE's "tension in the center of LB'  GAIT: Distance walked: 40 feet Assistive device utilized: None Level of assistance: Complete Independence Comments: LE's in ER; trunk flexed forward at hips .  TREATMENT DATE: 08/22/23 Therapeutic  exercises: Nustep L6 x 5 min  Single leg hip flexion step up on 6 inch step  x 20 B with one UE support Standing hip extension at 45 deg with red TB at ankles x 20 B Split stance B shoulder extension from high anchor for core BTB 2 x 10  Seated with ball behind back hip flexion 90 deg and up x 20 B Supine SLR R:  x 10 - increased range today.  Therapeutic activities:  Resisted walking 20# double handle held at waist x 10 Chops from high to low 10# R/L x 20 ea   TREATMENT DATE: 08/20/23 Therapeutic exercises: Nustep L6 x 5 min  Single leg hip flexion step up on 6 inch step  x 20 B with one UE support S/L R/L hip flexion (SLR) 2x 10 Supine hip ABD x 20 B  Supine SLR R:  1-2 inch 3 sec hold  reps to fatigue  L: 8 inch 5 sec hold x 10 S/L R/L hip ABD PT assist for positioning for glut med 5 sec hold 2 x 10 Standing hip extension at 45 deg at stairs x 20 B  Neuromuscular Re-education: Plank at mat table 30 sec hold x2  Therapeutic activities:  Bridge 5 sec hold 2x10   TREATMENT DATE: 08/15/23 Therapeutic exercises: Nustep L5 x 5 min  Single leg hip flexion step up on 6 inch step  x 20 B with one UE support Modified mountain climbers at counter x 10 ea does not feel a lot in core Standing hip extension to flexion on slider x 10 B - poles for UE support Standing hip ABD/ADD on slider x 10 B - poles for UE support S/L R/L hip flexion (SLR) 2x 10 S/L R/L hip ABD PT assist for positioning for glut med Supine hip ABD to fatigue Supine SLR to ~ 8 inch 3 sec hold R x 5; L x 10  Therapeutic activities:  Row blue TB 3 sec x 10 x 2  Shoulder extension blue TB 3 sec x 10 x 2  Standing shoulder flexion blue in split stance 2 x 10 Pallof push pull from midline two blue with pole 3 sec x 2 x 10 R/L  Split squat back foot on 12 inch step 3 sec x 10 R/L  Bridge 5 sec hold 2x10  Neuromuscular Re-education: Plank at mat table max hold x 5 reps PPT with SLR too difficult, sequential march  too difficult on R   PATIENT EDUCATION:  Education details: HEP Update - pt using App now Person educated: Patient Education method: Explanation, Demonstration, Tactile cues, Verbal cues, and Handouts Education comprehension: verbalized understanding, returned demonstration, verbal cues required, tactile cues required, and needs further education  HOME EXERCISE PROGRAM: Access Code: [email or web address] URL: [email or web address] Date: 08/13/2023 Prepared by: Concha Deed  Exercises - Prone Press Up  - 2 x daily - 7 x weekly - 1 sets - 10 reps - 2-3 sec  hold - Prone Press Up On Elbows  - 2 x daily - 7 x weekly - 1 sets - 3 reps - 30 sec  hold - Standing Back Extension  - 2 x daily - 7 x weekly - 1 sets - 3 reps - 2-3 sec  hold - Seated Hip Flexor Stretch  - 2 x daily - 7 x weekly - 1 sets - 3 reps - 30 sec  hold - Standing Piriformis Release with Ball at [employer]  - 2 x daily - 7 x weekly - 30-60 sec  hold - Prone Hip Extension  - 1 x daily - 3 x weekly - 2 sets - 10 reps - Seated Piriformis Stretch with Trunk Bend  - 2 x daily - 7 x weekly - 1 sets - 3 reps - 30-60 sec  hold - Supine Shoulder Flexion Extension AAROM with Dowel  - 1 x daily - 7 x weekly - 1-2 sets - 10 reps - 3 sec  hold - Beginner Bridge  - 1 x daily - 7 x weekly - 1 sets - 10 reps - 5 sec  hold - Standing Bilateral Low Shoulder Row with Anchored Resistance  - 1 x daily - 7 x weekly - 1-3 sets - 10 reps - 2-3 sec  hold - Shoulder extension with resistance - Neutral  - 1 x daily - 7 x weekly - 1-2 sets - 10 reps - 3-5 sec  hold - Anti-Rotation Lateral Stepping with Press  - 1 x daily - 7 x weekly - 1-2 sets - 10 reps - 2-3 sec  hold - Eccentric Hip Flexor Strengthening (Mirrored)  - 1 x daily - 7 x weekly - 2 sets - 10 reps - Plank with Elbows on Table  - 1 x daily - 3 x weekly - 1 sets - 5 reps - max hold - Supine Hip Abduction  - 1 x daily - 3 x weekly - 2 sets - 10 reps - Sidelying Hip Flexion  - 1 x daily - 3 x weekly - 2  sets - 10 reps  ASSESSMENT:  CLINICAL IMPRESSION: Patient is making significant progress with hip strength with MMT. She still demonstrates more weakness on the R side and weakness in her core. We focused on hip flexion above 90 degrees and core stabilization this week. This is more difficult in standing. She has improved with SLR against gravity and was able to complete 10 reps today with some eccentric fatigue in last few reps. She  continues to demonstrate potential for improvement and would benefit from continued skilled therapy to address remaining weaknesses. Pain has resolved, but she still reports discomfort in the back with sitting during workday.      EVAL: Patient is a 53 y.o. female who was seen today for physical therapy evaluation and treatment for recurrent LBP. E experienced initial symptoms following hysterectomy 5/24 with symptoms resolving in a few weeks. She noticed pain in the same area of LB ~ mid March which have persisted. She has also experienced edema in bilat LE's. Symptoms were better when she was on vacation for the past two weeks but recurred when she started back to work yesterday and have increased today. She has poor posture and alignment with forward flexed trunk. Patient has tightness through the hip flexors, R > L; weakness bilat LE's R > L; pain and tightness with PA mobs lumbar spine and tightness in the R > L lumbar paraspinals. She sits with weight shifted to one hip and has difficulty sitting with equal weight bearing bilat hips. She has sitting job and sedentary lifestyle sitting in a recliner  to rest when home. Patient will benefit from PT to address problems identified.    GOALS: Goals reviewed with patient? Yes  SHORT TERM GOALS: Target date: 08/20/2023   Independent in initial HEP  Baseline: Goal status: MET  2.  Patient demonstrates and verbalizes correct posture and alignment in sitting  Baseline:  Goal status: IN PROGRESS  3.  Patient reports  decreased pain by 25-50%  Baseline:  Goal status: MET   LONG TERM GOALS: Target date: 09/17/2023   Decrease pain by 75-100% allowing patient to return to normal functional and work activities  Baseline:  Goal status: INITIAL  2.  Patient reports ability to work with no more than 1-3/10 pain level  Baseline:  Goal status: INITIAL  3.  Increased lumbar ROM to [facility] and pain free  Baseline:  Goal status: INITIAL  4.  Increase strength bilat LE's to 4+/5 to 5/5  Baseline:  Goal status: INITIAL  5.  Independent in HEP including aquatic program as indicated  Baseline:  Goal status: INITIAL  6.  Improve Owestry score by 10-20 points  Baseline: 11/50; 22%  Goal status: INITIAL  PLAN:  PT FREQUENCY: 2x/week  PT DURATION: 8 weeks  PLANNED INTERVENTIONS: 97110-Therapeutic exercises, 97530- Therapeutic activity, 97112- Neuromuscular re-education, 97535- Self Care, [id number]- Manual therapy, [phone]- Aquatic Therapy, Patient/Family education, Balance training, Stair training, Taping, Dry Needling, and Joint mobilization.  PLAN FOR NEXT SESSION: continue to focus on  R hip flexor strength and core strength, continue with TPR and mobs prn.  Jinx Mourning, PT  08/22/23 8:52 AM

## 2023-08-22 ENCOUNTER — Ambulatory Visit: Payer: Self-pay | Admitting: Physical Therapy

## 2023-08-22 ENCOUNTER — Encounter: Payer: Self-pay | Admitting: Physical Therapy

## 2023-08-22 DIAGNOSIS — M5459 Other low back pain: Secondary | ICD-10-CM | POA: Diagnosis not present

## 2023-08-22 DIAGNOSIS — M6281 Muscle weakness (generalized): Secondary | ICD-10-CM

## 2023-08-22 DIAGNOSIS — R29898 Other symptoms and signs involving the musculoskeletal system: Secondary | ICD-10-CM

## 2023-08-28 ENCOUNTER — Ambulatory Visit: Payer: Self-pay | Admitting: Rehabilitative and Restorative Service Providers"

## 2023-08-28 ENCOUNTER — Encounter: Payer: Self-pay | Admitting: Rehabilitative and Restorative Service Providers"

## 2023-08-28 DIAGNOSIS — M5459 Other low back pain: Secondary | ICD-10-CM

## 2023-08-28 DIAGNOSIS — R29898 Other symptoms and signs involving the musculoskeletal system: Secondary | ICD-10-CM

## 2023-08-28 DIAGNOSIS — M6281 Muscle weakness (generalized): Secondary | ICD-10-CM

## 2023-08-28 NOTE — Therapy (Signed)
 OUTPATIENT PHYSICAL THERAPY THORACOLUMBAR TREATMENT   Patient Name: Michele Roman MRN: [id number] DOB:1970/04/10, 53 y.o., female Today's Date: 08/28/2023  END OF SESSION:  PT End of Session - 08/28/23 0916     Visit Number 10    Number of Visits 16    Date for PT Re-Evaluation 09/17/23    Authorization Type aetna copay none    Authorization Time Period year    Authorization - Visit Number 10    Authorization - Number of Visits 40    PT Start Time 0915    PT Stop Time 1000    PT Time Calculation (min) 45 min    Activity Tolerance Patient tolerated treatment well                   Past Medical History:  Diagnosis Date   Anemia    Endometrial polyp 02/22/2020   Family history of colon cancer 06/10/2018   Father age 81   H/O colonoscopy 03/01/2009   Menorrhagia with regular cycle 02/22/2020   Treated with endometrial ablation January 2022   Past Surgical History:  Procedure Laterality Date   APPENDECTOMY  1997   CERVICAL POLYPECTOMY  04/2020   CESAREAN SECTION  1999   COLONOSCOPY  2012   normal per pt   DILATION AND CURETTAGE, DIAGNOSTIC / THERAPEUTIC  04/2020   EXPLORATORY LAPAROTOMY  1999   Right foot surgery  2008, 2011   hardware in the foot   UTERINE ABLATION  04/2020   Patient Active Problem List   Diagnosis Date Noted   S/P laparoscopic hysterectomy 08/16/2022   Iron  deficiency anemia due to chronic blood loss 03/04/2020   Morbid obesity (HCC) 02/22/2020   Family history of colon cancer 06/10/2018   Anemia, iron  deficiency 06/25/2013   Fatty liver 04/15/2009    PCP: Cherre Cornish, NP  REFERRING PROVIDER: Cherre Cornish, NP  REFERRING DIAG: Chronic midline LBP  Rationale for Evaluation and Treatment: Rehabilitation  THERAPY DIAG:  Other low back pain  Other symptoms and signs involving the musculoskeletal system  Muscle weakness (generalized)  ONSET DATE: 06/15/23  SUBJECTIVE:                                                                                                                                                                                            SUBJECTIVE STATEMENT: No pain. The swelling is getting better - gone out of my ankles, but is still in my knees and thighs. Working from home this week so she is not sure if they have ordered and delivered her new chair. Does not go up and down steps often  but she does not have trouble with stairs now. Had the knot in her back for a short time on Friday but it did not last like it used to when she first started therapy. Wants some exercises to help with get the feeling of swelling out of the hip region.   EVAL:  Patient reports that she noticed LBP the day after hysterectomy last year. She treated pain with heat which was intermittent. She noticed pain and spider like appearance in the R LB area over the past 5 weeks or so. She had xrays which showed some degenerative changes. Feels the symptoms are associated with sitting in chair at work. She was on vacation for the past two weeks and had no discomfort. She returned to work yesterday and pain started again and is worse today. 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 Yes: NPRS scale: 0/10 Pain location: center of LB Pain description: knot Aggravating factors: sitting at work  Relieving factors: not working and she is OK sitting at home; recliner   PRECAUTIONS: None   WEIGHT BEARING RESTRICTIONS: No  FALLS:  Has patient fallen in last 6 months? No  LIVING ENVIRONMENT: Lives with: lives with their spouse Lives in: House/apartment Stairs: Yes: Internal: 12-14 steps; on right going up and External: 1 steps; none Has following equipment at home: None  OCCUPATION: desk and computer work x 9 years;  household chores; driving daughter to activities; Agricultural consultant at [employer]   PATIENT GOALS: get rid of the pain   NEXT MD VISIT: none scheduled   OBJECTIVE:  Note: Objective measures were completed at Evaluation unless otherwise noted.  DIAGNOSTIC FINDINGS:  Xray - 07/02/23: Leftward curvature lumbar spine. Preservation of the vertebral body heights. Degenerative disc disease most pronounced L1-2 and L5-S1. Stool throughout the colon. SI joints are unremarkable.   IMPRESSION: Degenerative disc disease most pronounced L1-2 and L5-S1.  PATIENT SURVEYS:  Oswestry 11/50; 22%     SENSATION: WFL  MUSCLE LENGTH: Hamstrings: Right 75 deg; Left 75 deg Thomas test: Right -10-20 deg; Left -10-20 deg  POSTURE: rounded shoulders, forward head, flexed trunk , and LE's in ER   PALPATION: Tightness bilat hip flexors L > R; lumbar paraspinals R > L  Pain with PA mobs sacrum; lower lumbar    LUMBAR ROM:   AROM eval  Flexion 30% pain   Extension 20% pain   Right lateral flexion 60% pull L  Left lateral flexion 55% pull R   Right rotation 20%   Left rotation 20%   (Blank rows = not tested)  LOWER EXTREMITY ROM:     Active  Right eval Left eval  Hip flexion Tight  Tight   Hip extension Tight  Tight   Hip abduction    Hip adduction    Hip internal rotation Tight   Hip external rotation    Knee flexion    Knee extension    Ankle dorsiflexion    Ankle plantarflexion    Ankle inversion    Ankle eversion     (Blank rows = not tested)  LOWER EXTREMITY MMT:    MMT Right eval Left eval Right 5/22 Left 5/22  Hip flexion 4- 4 4+ 5  Hip extension 4- 4 5 5   Hip abduction 4-  4 4+ 5  Hip adduction   4+ 4+  Hip internal rotation      Hip external rotation      Knee flexion 5 5    Knee extension 5 5    Ankle dorsiflexion      Ankle plantarflexion      Ankle inversion      Ankle eversion       (Blank rows = not tested)  LUMBAR SPECIAL TESTS:  Straight leg raise test: Negative  and Slump test: Negative  FUNCTIONAL TESTS:  5 times sit to stand: 20.81 use of UE's "tension in the center of LB'  GAIT: Distance walked: 40 feet Assistive device utilized: None Level of assistance: Complete Independence Comments: LE's in ER; trunk flexed forward at hips . TREATMENT DATE: 08/28/23 Therapeutic exercises: Nustep L6 x 6 min  Single leg hip flexion step up on 6 inch step  x 20 B with one UE support Standing hip extension at 45 deg with red TB at ankles x 20 B Split stance B shoulder extension from high anchor for core BTB 2 x 10  Paloff press blue TB x 10 R/L  Paloff press with shoulder flexion/extension in extended position Blue TB x 10 R/L  Seated with ball behind back hip flexion 90 deg and up x 20 B Supine SLR R:  x 10 x 1 set; x 5 x 1 set Bridging 5 sec x 10   Therapeutic activities:  Mountain climber in plank on counter ~ 20 reps R/L  Prone glut set 5 sec x 10 Prone quad set toe resting on table 5 sec x 10  Prone quad stretch with PT assist 15 sec x 2 Prone quad stretch with strap x 30 sec x 2 R/L Myofacial ball release ball in R psoas muscle pt prone gentle rocking; knee flexion by PT total time ~ 3-4 min      TREATMENT DATE: 08/22/23 Therapeutic exercises: Nustep L6 x 5 min  Single leg hip flexion step up on 6 inch step  x 20 B with one UE support Standing hip extension at 45 deg with red TB at ankles x 20 B Split stance B shoulder extension from high anchor for core BTB 2 x 10  Seated with ball behind back hip flexion 90 deg and up x 20 B Supine SLR R:  x 10 - increased range today.  Therapeutic activities:  Resisted walking 20# double handle held at waist x 10 Chops from high to low 10# R/L x 20 ea   TREATMENT DATE: 08/20/23 Therapeutic exercises: Nustep L6 x 5 min  Single leg hip flexion step up on 6 inch step  x 20 B with one UE support S/L R/L hip flexion (SLR) 2x 10 Supine hip ABD x 20 B  Supine SLR R:  1-2 inch 3 sec hold  reps to  fatigue  L: 8 inch 5 sec hold x 10 S/L R/L hip ABD PT assist for positioning for glut med 5 sec hold 2 x 10 Standing hip extension at 45 deg at stairs x 20 B  Neuromuscular Re-education: Plank at mat table 30 sec hold x2  Therapeutic activities:  Bridge 5 sec hold 2x10   TREATMENT DATE: 08/15/23 Therapeutic exercises: Nustep L5 x 5 min  Single leg hip flexion step up on 6 inch step  x 20 B with one UE support Modified mountain climbers at counter x 10 ea does not feel a lot in core Standing hip extension  to flexion on slider x 10 B - poles for UE support Standing hip ABD/ADD on slider x 10 B - poles for UE support S/L R/L hip flexion (SLR) 2x 10 S/L R/L hip ABD PT assist for positioning for glut med Supine hip ABD to fatigue Supine SLR to ~ 8 inch 3 sec hold R x 5; L x 10  Therapeutic activities:  Row blue TB 3 sec x 10 x 2  Shoulder extension blue TB 3 sec x 10 x 2  Standing shoulder flexion blue in split stance 2 x 10 Pallof push pull from midline two blue with pole 3 sec x 2 x 10 R/L  Split squat back foot on 12 inch step 3 sec x 10 R/L  Bridge 5 sec hold 2x10  Neuromuscular Re-education: Plank at mat table max hold x 5 reps PPT with SLR too difficult, sequential march too difficult on R   PATIENT EDUCATION:  Education details: HEP Update - pt using App now Person educated: Patient Education method: Programmer, multimedia, Demonstration, Tactile cues, Verbal cues, and Handouts Education comprehension: verbalized understanding, returned demonstration, verbal cues required, tactile cues required, and needs further education  HOME EXERCISE PROGRAM: Access Code: [email or web address] URL: [email or web address] Date: 08/28/2023 Prepared by: Ramaj Frangos  Exercises - Prone Press Up  - 2 x daily - 7 x weekly - 1 sets - 10 reps - 2-3 sec  hold - Prone Press Up On Elbows  - 2 x daily - 7 x weekly - 1 sets - 3 reps - 30 sec  hold - Standing Back Extension  - 2 x daily - 7 x weekly - 1  sets - 3 reps - 2-3 sec  hold - Seated Hip Flexor Stretch  - 2 x daily - 7 x weekly - 1 sets - 3 reps - 30 sec  hold - Standing Piriformis Release with Ball at [employer]  - 2 x daily - 7 x weekly - 30-60 sec  hold - Prone Hip Extension  - 1 x daily - 3 x weekly - 2 sets - 10 reps - Seated Piriformis Stretch with Trunk Bend  - 2 x daily - 7 x weekly - 1 sets - 3 reps - 30-60 sec  hold - Supine Shoulder Flexion Extension AAROM with Dowel  - 1 x daily - 7 x weekly - 1-2 sets - 10 reps - 3 sec  hold - Beginner Bridge  - 1 x daily - 7 x weekly - 1 sets - 10 reps - 5 sec  hold - Standing Bilateral Low Shoulder Row with Anchored Resistance  - 1 x daily - 7 x weekly - 1-3 sets - 10 reps - 2-3 sec  hold - Shoulder extension with resistance - Neutral  - 1 x daily - 7 x weekly - 1-2 sets - 10 reps - 3-5 sec  hold - Anti-Rotation Lateral Stepping with Press  - 1 x daily - 7 x weekly - 1-2 sets - 10 reps - 2-3 sec  hold - Eccentric Hip Flexor Strengthening (Mirrored)  - 1 x daily - 7 x weekly - 2 sets - 10 reps - Plank with Elbows on Table  - 1 x daily - 3 x weekly - 1 sets - 5 reps - max hold - Supine Hip Abduction  - 1 x daily - 3 x weekly - 2 sets - 10 reps - Sidelying Hip Flexion  - 1 x daily - 3 x weekly - 2 sets - 10  reps - Prone Gluteal Sets  - 2 x daily - 7 x weekly - 1 sets - 10 reps - 10 sec  hold - Prone Quadriceps Set  - 2 x daily - 7 x weekly - 1-2 sets - 10 reps - 5-10 sec  hold - Standing Hip Extension with Counter Support  - 2 x daily - 7 x weekly - 1-2 sets - 10 reps - 3-5 sec  hold - Prone Quadriceps Stretch with Strap  - 2 x daily - 7 x weekly - 1 sets - 3 reps - 30 sec  hold  ASSESSMENT:  CLINICAL IMPRESSION: Patient continues to progress with minimal to no pain in the LB and increasing hip strength. Treatment continues to address weakness on the R side and weakness in her core. Focused on hip flexion above 90 degrees and core stabilization today as well as stretching and myofacial release of  hip flexors. Treatment tolerated well. Pain has resolved, but she still reports discomfort in the back with sitting during workday. Will benefit from continued treatment to address deficits.    EVAL: Patient is a 53 y.o. female who was seen today for physical therapy evaluation and treatment for recurrent LBP. E experienced initial symptoms following hysterectomy 5/24 with symptoms resolving in a few weeks. She noticed pain in the same area of LB ~ mid March which have persisted. She has also experienced edema in bilat LE's. Symptoms were better when she was on vacation for the past two weeks but recurred when she started back to work yesterday and have increased today. She has poor posture and alignment with forward flexed trunk. Patient has tightness through the hip flexors, R > L; weakness bilat LE's R > L; pain and tightness with PA mobs lumbar spine and tightness in the R > L lumbar paraspinals. She sits with weight shifted to one hip and has difficulty sitting with equal weight bearing bilat hips. She has sitting job and sedentary lifestyle sitting in a recliner to rest when home. Patient will benefit from PT to address problems identified.    GOALS: Goals reviewed with patient? Yes  SHORT TERM GOALS: Target date: 08/20/2023   Independent in initial HEP  Baseline: Goal status: MET  2.  Patient demonstrates and verbalizes correct posture and alignment in sitting  Baseline:  Goal status: met  3.  Patient reports decreased pain by 25-50%  Baseline:  Goal status: MET   LONG TERM GOALS: Target date: 09/17/2023   Decrease pain by 75-100% allowing patient to return to normal functional and work activities  Baseline:  Goal status: INITIAL  2.  Patient reports ability to work with no more than 1-3/10 pain level  Baseline:  Goal status: INITIAL  3.  Increased lumbar ROM to [facility] and pain free  Baseline:  Goal status: INITIAL  4.  Increase strength bilat LE's to 4+/5 to 5/5   Baseline:  Goal status: INITIAL  5.  Independent in HEP including aquatic program as indicated  Baseline:  Goal status: INITIAL  6.  Improve Owestry score by 10-20 points  Baseline: 11/50; 22%  Goal status: INITIAL  PLAN:  PT FREQUENCY: 2x/week  PT DURATION: 8 weeks  PLANNED INTERVENTIONS: 97110-Therapeutic exercises, 97530- Therapeutic activity, 97112- Neuromuscular re-education, 97535- Self Care, [id number]- Manual therapy, [phone]- Aquatic Therapy, Patient/Family education, Balance training, Stair training, Taping, Dry Needling, and Joint mobilization.  PLAN FOR NEXT SESSION: continue to focus on  R hip flexor strength and core strength, continue with TPR  and mobs prn.  Daylin Eads P. Geraldo Klippel PT, MPH 08/28/23 9:17 AM

## 2023-09-02 NOTE — Therapy (Signed)
 OUTPATIENT PHYSICAL THERAPY THORACOLUMBAR TREATMENT   Patient Name: Michele Roman MRN: [id number] DOB:05/10/1970, 53 y.o., female Today's Date: 09/03/2023  END OF SESSION:  PT End of Session - 09/03/23 1148     Visit Number 11    Number of Visits 16    Date for PT Re-Evaluation 09/17/23    Authorization Type aetna copay none    Authorization Time Period year    Authorization - Visit Number 11    Authorization - Number of Visits 40    PT Start Time 1148    PT Stop Time 1231    PT Time Calculation (min) 43 min    Activity Tolerance Patient tolerated treatment well    Behavior During Therapy [facility] for tasks assessed/performed                    Past Medical History:  Diagnosis Date   Anemia    Endometrial polyp 02/22/2020   Family history of colon cancer 06/10/2018   Father age 64   H/O colonoscopy 03/01/2009   Menorrhagia with regular cycle 02/22/2020   Treated with endometrial ablation January 2022   Past Surgical History:  Procedure Laterality Date   APPENDECTOMY  1997   CERVICAL POLYPECTOMY  04/2020   CESAREAN SECTION  1999   COLONOSCOPY  2012   normal per pt   DILATION AND CURETTAGE, DIAGNOSTIC / THERAPEUTIC  04/2020   EXPLORATORY LAPAROTOMY  1999   Right foot surgery  2008, 2011   hardware in the foot   UTERINE ABLATION  04/2020   Patient Active Problem List   Diagnosis Date Noted   S/P laparoscopic hysterectomy 08/16/2022   Iron  deficiency anemia due to chronic blood loss 03/04/2020   Morbid obesity (HCC) 02/22/2020   Family history of colon cancer 06/10/2018   Anemia, iron  deficiency 06/25/2013   Fatty liver 04/15/2009    PCP: Cherre Cornish, NP  REFERRING PROVIDER: Cherre Cornish, NP  REFERRING DIAG: Chronic midline LBP  Rationale for Evaluation and Treatment: Rehabilitation  THERAPY DIAG:  Other low back pain  Other symptoms and signs involving the musculoskeletal system  Muscle weakness (generalized)  ONSET DATE: 06/15/23  SUBJECTIVE:                                                                                                                                                                                            SUBJECTIVE STATEMENT: My back is aching because I started using my stand up desk. Tried the ball release, but did not get the relief as well as I did on the table here. Also getting more swelling in inner  thigh since standing.   EVAL:  Patient reports that she noticed LBP the day after hysterectomy last year. She treated pain with heat which was intermittent. She noticed pain and spider like appearance in the R LB area over the past 5 weeks or so. She had xrays which showed some degenerative changes. Feels the symptoms are associated with sitting in chair at work. She was on vacation for the past two weeks and had no discomfort. She returned to work yesterday and pain started again and is worse today. 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 Yes: NPRS scale: 0/10 Pain location: center of LB Pain description: knot Aggravating factors: sitting at work  Relieving factors: not working and she is OK sitting at home; recliner   PRECAUTIONS: None   WEIGHT BEARING RESTRICTIONS: No  FALLS:  Has patient fallen in last 6 months? No  LIVING ENVIRONMENT: Lives with: lives with their spouse Lives in: House/apartment Stairs: Yes: Internal: 12-14 steps; on right going up and External: 1 steps; none Has following equipment at home: None  OCCUPATION: desk and computer work x 9 years; household chores; driving daughter to activities; Agricultural consultant at [employer]   PATIENT GOALS: get rid of the pain   NEXT MD VISIT: none scheduled   OBJECTIVE:  Note: Objective measures were completed at Evaluation unless  otherwise noted.  DIAGNOSTIC FINDINGS:  Xray - 07/02/23: Leftward curvature lumbar spine. Preservation of the vertebral body heights. Degenerative disc disease most pronounced L1-2 and L5-S1. Stool throughout the colon. SI joints are unremarkable.   IMPRESSION: Degenerative disc disease most pronounced L1-2 and L5-S1.  PATIENT SURVEYS:  Oswestry 11/50; 22%     SENSATION: WFL  MUSCLE LENGTH: Hamstrings: Right 75 deg; Left 75 deg Thomas test: Right -10-20 deg; Left -10-20 deg  POSTURE: rounded shoulders, forward head, flexed trunk , and LE's in ER   PALPATION: Tightness bilat hip flexors L > R; lumbar paraspinals R > L  Pain with PA mobs sacrum; lower lumbar    LUMBAR ROM:   AROM eval  Flexion 30% pain   Extension 20% pain   Right lateral flexion 60% pull L  Left lateral flexion 55% pull R   Right rotation 20%   Left rotation 20%   (Blank rows = not tested)  LOWER EXTREMITY ROM:     Active  Right eval Left eval  Hip flexion Tight  Tight   Hip extension Tight  Tight   Hip abduction    Hip adduction    Hip internal rotation Tight   Hip external rotation    Knee flexion    Knee extension    Ankle dorsiflexion    Ankle plantarflexion    Ankle inversion    Ankle eversion     (Blank rows = not tested)  LOWER EXTREMITY MMT:    MMT Right eval Left eval Right 5/22 Left 5/22  Hip flexion 4- 4 4+ 5  Hip extension 4- 4 5 5   Hip abduction 4- 4 4+ 5  Hip adduction   4+ 4+  Hip internal rotation      Hip external rotation      Knee flexion 5 5    Knee extension 5 5    Ankle dorsiflexion  Ankle plantarflexion      Ankle inversion      Ankle eversion       (Blank rows = not tested)  LUMBAR SPECIAL TESTS:  Straight leg raise test: Negative and Slump test: Negative  FUNCTIONAL TESTS:  5 times sit to stand: 20.81 use of UE's "tension in the center of LB'  GAIT: Distance walked: 40 feet Assistive device utilized: None Level of assistance: Complete  Independence Comments: LE's in ER; trunk flexed forward at hips . TREATMENT DATE: 09/03/23 Therapeutic exercises: Nustep L6 x 7 min  Single leg hip flexion step up on 6 inch step  x 20 B with one UE support Standing hip extension at 45 deg with red TB at ankles x 20 B Split stance B shoulder extension from high anchor for core BTB 2 x 10  Paloff press blue TB x 10 R/L  Paloff press with shoulder flexion/extension in extended position Blue TB x 10 R/L  Seated with ball behind back hip flexion 90 deg and up x 20 B  Therapeutic activities:  Mountain climber in plank on mat table ~ 20 reps Prone quad set toe resting on table 5 sec x 10  Prone quad stretch with strap x 30 sec x 2 R/L Myofacial ball release ball in R psoas muscle pt prone gentle rocking; knee flexion by PT total time ~ 3-4 min   TREATMENT DATE: 08/28/23 Therapeutic exercises: Nustep L6 x 6 min  Single leg hip flexion step up on 6 inch step  x 20 B with one UE support Standing hip extension at 45 deg with red TB at ankles x 20 B Split stance B shoulder extension from high anchor for core BTB 2 x 10  Paloff press blue TB x 10 R/L  Paloff press with shoulder flexion/extension in extended position Blue TB x 10 R/L  Seated with ball behind back hip flexion 90 deg and up x 20 B Supine SLR R:  x 10 x 1 set; x 5 x 1 set Bridging 5 sec x 10   Therapeutic activities:  Mountain climber in plank on counter ~ 20 reps R/L  Prone glut set 5 sec x 10 Prone quad set toe resting on table 5 sec x 10  Prone quad stretch with PT assist 15 sec x 2 Prone quad stretch with strap x 30 sec x 2 R/L Myofacial ball release ball in R psoas muscle pt prone gentle rocking; knee flexion by PT total time ~ 3-4 min    TREATMENT DATE: 08/22/23 Therapeutic exercises: Nustep L6 x 5 min  Single leg hip flexion step up on 6 inch step  x 20 B with one UE support Standing hip extension at 45 deg with red TB at ankles x 20 B Split stance B shoulder  extension from high anchor for core BTB 2 x 10  Seated with ball behind back hip flexion 90 deg and up x 20 B Supine SLR R:  x 10 - increased range today.  Therapeutic activities:  Resisted walking 20# double handle held at waist x 10 Chops from high to low 10# R/L x 20 ea   TREATMENT DATE: 08/20/23 Therapeutic exercises: Nustep L6 x 5 min  Single leg hip flexion step up on 6 inch step  x 20 B with one UE support S/L R/L hip flexion (SLR) 2x 10 Supine hip ABD x 20 B  Supine SLR R:  1-2 inch 3 sec hold  reps to fatigue  L:  8 inch 5 sec hold x 10 S/L R/L hip ABD PT assist for positioning for glut med 5 sec hold 2 x 10 Standing hip extension at 45 deg at stairs x 20 B  Neuromuscular Re-education: Plank at mat table 30 sec hold x2  Therapeutic activities:  Bridge 5 sec hold 2x10   PATIENT EDUCATION:  Education details: HEP Update - pt using App now Person educated: Patient Education method: Explanation, Demonstration, Tactile cues, Verbal cues, and Handouts Education comprehension: verbalized understanding, returned demonstration, verbal cues required, tactile cues required, and needs further education  HOME EXERCISE PROGRAM: Access Code: [email or web address] URL: [email or web address] Date: 08/28/2023 Prepared by: Celyn Holt  Exercises - Prone Press Up  - 2 x daily - 7 x weekly - 1 sets - 10 reps - 2-3 sec  hold - Prone Press Up On Elbows  - 2 x daily - 7 x weekly - 1 sets - 3 reps - 30 sec  hold - Standing Back Extension  - 2 x daily - 7 x weekly - 1 sets - 3 reps - 2-3 sec  hold - Seated Hip Flexor Stretch  - 2 x daily - 7 x weekly - 1 sets - 3 reps - 30 sec  hold - Standing Piriformis Release with Ball at [employer]  - 2 x daily - 7 x weekly - 30-60 sec  hold - Prone Hip Extension  - 1 x daily - 3 x weekly - 2 sets - 10 reps - Seated Piriformis Stretch with Trunk Bend  - 2 x daily - 7 x weekly - 1 sets - 3 reps - 30-60 sec  hold - Supine Shoulder Flexion Extension AAROM with  Dowel  - 1 x daily - 7 x weekly - 1-2 sets - 10 reps - 3 sec  hold - Beginner Bridge  - 1 x daily - 7 x weekly - 1 sets - 10 reps - 5 sec  hold - Standing Bilateral Low Shoulder Row with Anchored Resistance  - 1 x daily - 7 x weekly - 1-3 sets - 10 reps - 2-3 sec  hold - Shoulder extension with resistance - Neutral  - 1 x daily - 7 x weekly - 1-2 sets - 10 reps - 3-5 sec  hold - Anti-Rotation Lateral Stepping with Press  - 1 x daily - 7 x weekly - 1-2 sets - 10 reps - 2-3 sec  hold - Eccentric Hip Flexor Strengthening (Mirrored)  - 1 x daily - 7 x weekly - 2 sets - 10 reps - Plank with Elbows on Table  - 1 x daily - 3 x weekly - 1 sets - 5 reps - max hold - Supine Hip Abduction  - 1 x daily - 3 x weekly - 2 sets - 10 reps - Sidelying Hip Flexion  - 1 x daily - 3 x weekly - 2 sets - 10 reps - Prone Gluteal Sets  - 2 x daily - 7 x weekly - 1 sets - 10 reps - 10 sec  hold - Prone Quadriceps Set  - 2 x daily - 7 x weekly - 1-2 sets - 10 reps - 5-10 sec  hold - Standing Hip Extension with Counter Support  - 2 x daily - 7 x weekly - 1-2 sets - 10 reps - 3-5 sec  hold - Prone Quadriceps Stretch with Strap  - 2 x daily - 7 x weekly - 1 sets - 3 reps - 30 sec  hold  ASSESSMENT:  CLINICAL IMPRESSION: Patient continues to improve with pain and strength. She has met two of her LTGs reporting 80% improvement overall. She still has tightness and weakness in her R hip flexor. Good response again today with self MFR using ball. She is progressing appropriately and should be ready for discharge at the end of her POC.   EVAL: Patient is a 53 y.o. female who was seen today for physical therapy evaluation and treatment for recurrent LBP. E experienced initial symptoms following hysterectomy 5/24 with symptoms resolving in a few weeks. She noticed pain in the same area of LB ~ mid March which have persisted. She has also experienced edema in bilat LE's. Symptoms were better when she was on vacation for the past two  weeks but recurred when she started back to work yesterday and have increased today. She has poor posture and alignment with forward flexed trunk. Patient has tightness through the hip flexors, R > L; weakness bilat LE's R > L; pain and tightness with PA mobs lumbar spine and tightness in the R > L lumbar paraspinals. She sits with weight shifted to one hip and has difficulty sitting with equal weight bearing bilat hips. She has sitting job and sedentary lifestyle sitting in a recliner to rest when home. Patient will benefit from PT to address problems identified.    GOALS: Goals reviewed with patient? Yes  SHORT TERM GOALS: Target date: 08/20/2023   Independent in initial HEP  Baseline: Goal status: MET  2.  Patient demonstrates and verbalizes correct posture and alignment in sitting  Baseline:  Goal status: met  3.  Patient reports decreased pain by 25-50%  Baseline:  Goal status: MET   LONG TERM GOALS: Target date: 09/17/2023   Decrease pain by 75-100% allowing patient to return to normal functional and work activities  Baseline:  Goal status: MET 80% 09/03/23  2.  Patient reports ability to work with no more than 1-3/10 pain level  Baseline:  Goal status: MET Just mild knot feeling, not pain. 09/03/23  3.  Increased lumbar ROM to [facility] and pain free  Baseline:  Goal status: INITIAL  4.  Increase strength bilat LE's to 4+/5 to 5/5  Baseline:  Goal status: INITIAL  5.  Independent in HEP including aquatic program as indicated  Baseline:  Goal status: DEFERRED  6.  Improve Owestry score by 10-20 points  Baseline: 11/50; 22%  Goal status: INITIAL  PLAN:  PT FREQUENCY: 2x/week  PT DURATION: 8 weeks  PLANNED INTERVENTIONS: 97110-Therapeutic exercises, 97530- Therapeutic activity, 97112- Neuromuscular re-education, 97535- Self Care, 16109- Manual therapy, [phone]- Aquatic Therapy, Patient/Family education, Balance training, Stair training, Taping, Dry Needling, and Joint  mobilization.  PLAN FOR NEXT SESSION: continue to focus on  R hip flexor strength and core strength, continue with TPR and mobs prn.  Jinx Mourning, PT 09/03/23 12:38 PM

## 2023-09-03 ENCOUNTER — Ambulatory Visit: Payer: Self-pay | Attending: Medical-Surgical | Admitting: Physical Therapy

## 2023-09-03 ENCOUNTER — Encounter: Payer: Self-pay | Admitting: Physical Therapy

## 2023-09-03 DIAGNOSIS — R29898 Other symptoms and signs involving the musculoskeletal system: Secondary | ICD-10-CM | POA: Insufficient documentation

## 2023-09-03 DIAGNOSIS — M5459 Other low back pain: Secondary | ICD-10-CM | POA: Insufficient documentation

## 2023-09-03 DIAGNOSIS — M6281 Muscle weakness (generalized): Secondary | ICD-10-CM | POA: Insufficient documentation

## 2023-09-05 ENCOUNTER — Encounter: Payer: Self-pay | Admitting: Rehabilitative and Restorative Service Providers"

## 2023-09-05 ENCOUNTER — Ambulatory Visit: Payer: Self-pay | Admitting: Rehabilitative and Restorative Service Providers"

## 2023-09-05 DIAGNOSIS — M5459 Other low back pain: Secondary | ICD-10-CM

## 2023-09-05 DIAGNOSIS — M6281 Muscle weakness (generalized): Secondary | ICD-10-CM

## 2023-09-05 DIAGNOSIS — R29898 Other symptoms and signs involving the musculoskeletal system: Secondary | ICD-10-CM

## 2023-09-05 NOTE — Therapy (Signed)
 OUTPATIENT PHYSICAL THERAPY THORACOLUMBAR TREATMENT   Patient Name: Michele Roman MRN: [id number] DOB:May 09, 1970, 53 y.o., female Today's Date: 09/05/2023  END OF SESSION:  PT End of Session - 09/05/23 1133     Visit Number 12    Number of Visits 16    Date for PT Re-Evaluation 09/17/23    Authorization Type aetna copay none    Authorization Time Period year    Authorization - Visit Number 12    Authorization - Number of Visits 40    PT Start Time 1140    PT Stop Time 1230    PT Time Calculation (min) 50 min    Activity Tolerance Patient tolerated treatment well                    Past Medical History:  Diagnosis Date   Anemia    Endometrial polyp 02/22/2020   Family history of colon cancer 06/10/2018   Father age 22   H/O colonoscopy 03/01/2009   Menorrhagia with regular cycle 02/22/2020   Treated with endometrial ablation January 2022   Past Surgical History:  Procedure Laterality Date   APPENDECTOMY  1997   CERVICAL POLYPECTOMY  04/2020   CESAREAN SECTION  1999   COLONOSCOPY  2012   normal per pt   DILATION AND CURETTAGE, DIAGNOSTIC / THERAPEUTIC  04/2020   EXPLORATORY LAPAROTOMY  1999   Right foot surgery  2008, 2011   hardware in the foot   UTERINE ABLATION  04/2020   Patient Active Problem List   Diagnosis Date Noted   S/P laparoscopic hysterectomy 08/16/2022   Iron  deficiency anemia due to chronic blood loss 03/04/2020   Morbid obesity (HCC) 02/22/2020   Family history of colon cancer 06/10/2018   Anemia, iron  deficiency 06/25/2013   Fatty liver 04/15/2009    PCP: Cherre Cornish, NP  REFERRING PROVIDER: Cherre Cornish, NP  REFERRING DIAG: Chronic midline LBP  Rationale for Evaluation and Treatment: Rehabilitation  THERAPY DIAG:  Other low back pain  Other symptoms and signs involving the musculoskeletal system  Muscle weakness (generalized)  ONSET DATE: 06/15/23  SUBJECTIVE:                                                                                                                                                                                            SUBJECTIVE STATEMENT: Woke up today with "that knot" in my back. Sleeps on her back for the past month b/c it is more comfortable. Used to sleep on her sides. Adjusting to the stand up desk but is trying to take little breaks during the day to sit for  a few minutes which helps. Likes the ball release. Swelling is getting better.   EVAL:  Patient reports that she noticed LBP the day after hysterectomy last year. She treated pain with heat which was intermittent. She noticed pain and spider like appearance in the R LB area over the past 5 weeks or so. She had xrays which showed some degenerative changes. Feels the symptoms are associated with sitting in chair at work. She was on vacation for the past two weeks and had no discomfort. She returned to work yesterday and pain started again and is worse today. 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 Yes: NPRS scale: 0/10 Pain location: center of LB Pain description: knot Aggravating factors: sitting at work  Relieving factors: not working and she is OK sitting at home; recliner   PRECAUTIONS: None   WEIGHT BEARING RESTRICTIONS: No  FALLS:  Has patient fallen in last 6 months? No  LIVING ENVIRONMENT: Lives with: lives with their spouse Lives in: House/apartment Stairs: Yes: Internal: 12-14 steps; on right going up and External: 1 steps; none Has following equipment at home: None  OCCUPATION: desk and computer work x 9 years; household chores; driving daughter to activities; Agricultural consultant at [employer]   PATIENT GOALS: get rid of the pain   NEXT MD VISIT: none scheduled   OBJECTIVE:  Note: Objective  measures were completed at Evaluation unless otherwise noted.  DIAGNOSTIC FINDINGS:  Xray - 07/02/23: Leftward curvature lumbar spine. Preservation of the vertebral body heights. Degenerative disc disease most pronounced L1-2 and L5-S1. Stool throughout the colon. SI joints are unremarkable.   IMPRESSION: Degenerative disc disease most pronounced L1-2 and L5-S1.  PATIENT SURVEYS:  Oswestry 11/50; 22%     SENSATION: WFL  MUSCLE LENGTH: Hamstrings: Right 75 deg; Left 75 deg Thomas test: Right -10-20 deg; Left -10-20 deg  POSTURE: rounded shoulders, forward head, flexed trunk , and LE's in ER   PALPATION: Tightness bilat hip flexors L > R; lumbar paraspinals R > L  Pain with PA mobs sacrum; lower lumbar    LUMBAR ROM:   AROM eval  Flexion 30% pain   Extension 20% pain   Right lateral flexion 60% pull L  Left lateral flexion 55% pull R   Right rotation 20%   Left rotation 20%   (Blank rows = not tested)  LOWER EXTREMITY ROM:     Active  Right eval Left eval  Hip flexion Tight  Tight   Hip extension Tight  Tight   Hip abduction    Hip adduction    Hip internal rotation Tight   Hip external rotation    Knee flexion    Knee extension    Ankle dorsiflexion    Ankle plantarflexion    Ankle inversion    Ankle eversion     (Blank rows = not tested)  LOWER EXTREMITY MMT:    MMT Right eval Left eval Right 5/22 Left 5/22  Hip flexion 4- 4 4+ 5  Hip extension 4- 4 5 5   Hip abduction 4- 4 4+ 5  Hip adduction   4+ 4+  Hip internal rotation      Hip external rotation      Knee flexion 5 5    Knee extension 5 5  Ankle dorsiflexion      Ankle plantarflexion      Ankle inversion      Ankle eversion       (Blank rows = not tested)  LUMBAR SPECIAL TESTS:  Straight leg raise test: Negative and Slump test: Negative  FUNCTIONAL TESTS:  5 times sit to stand: 20.81 use of UE's "tension in the center of LB'  GAIT: Distance walked: 40 feet Assistive device  utilized: None Level of assistance: Complete Independence Comments: LE's in ER; trunk flexed forward at hips . TREATMENT DATE: 09/05/23 Therapeutic exercises: Nustep L7 x 10 min  SLS small range 3 sec x 20 R/L  Single leg hip flexion step up on 6 inch step  x 20 B with one UE support Standing hip extension at 45 deg with red TB at ankles x 20 B Split stance B shoulder extension from high anchor for core BTB 2 x 10 x 2 sets  Paloff press blue TB x 20 R/L  Paloff press with shoulder flexion/extension in extended position Blue TB x 10 R/L   Therapeutic activities:  Prone quad stretch with strap x 30 sec x 2 R/L Myofacial ball release ball in R psoas muscle pt prone gentle rocking; knee flexion by PT total time ~ 3-4 min    TREATMENT DATE: 09/03/23 Therapeutic exercises: Nustep L6 x 7 min  Single leg hip flexion step up on 6 inch step  x 20 B with one UE support Standing hip extension at 45 deg with red TB at ankles x 20 B Split stance B shoulder extension from high anchor for core BTB 2 x 10  Paloff press blue TB x 10 R/L  Paloff press with shoulder flexion/extension in extended position Blue TB x 10 R/L  Seated with ball behind back hip flexion 90 deg and up x 20 B  Therapeutic activities:  Mountain climber in plank on mat table ~ 20 reps Prone quad set toe resting on table 5 sec x 10  Prone quad stretch with strap x 30 sec x 2 R/L Myofacial ball release ball in R psoas muscle pt prone gentle rocking; knee flexion by PT total time ~ 3-4 min   TREATMENT DATE: 08/28/23 Therapeutic exercises: Nustep L6 x 6 min  Single leg hip flexion step up on 6 inch step  x 20 B with one UE support Standing hip extension at 45 deg with red TB at ankles x 20 B Split stance B shoulder extension from high anchor for core BTB 2 x 10  Paloff press blue TB x 10 R/L  Paloff press with shoulder flexion/extension in extended position Blue TB x 10 R/L  Seated with ball behind back hip flexion 90 deg and up x  20 B Supine SLR R:  x 10 x 1 set; x 5 x 1 set Bridging 5 sec x 10   Therapeutic activities:  Mountain climber in plank on counter ~ 20 reps R/L  Prone glut set 5 sec x 10 Prone quad set toe resting on table 5 sec x 10  Prone quad stretch with PT assist 15 sec x 2 Prone quad stretch with strap x 30 sec x 2 R/L Myofacial ball release ball in R psoas muscle pt prone gentle rocking; knee flexion by PT total time ~ 3-4 min    TREATMENT DATE: 08/22/23 Therapeutic exercises: Nustep L6 x 5 min  Single leg hip flexion step up on 6 inch step  x 20 B with one UE  support Standing hip extension at 45 deg with red TB at ankles x 20 B Split stance B shoulder extension from high anchor for core BTB 2 x 10  Seated with ball behind back hip flexion 90 deg and up x 20 B Supine SLR R:  x 10 - increased range today.  Therapeutic activities:  Resisted walking 20# double handle held at waist x 10 Chops from high to low 10# R/L x 20 ea   TREATMENT DATE: 08/20/23 Therapeutic exercises: Nustep L6 x 5 min  Single leg hip flexion step up on 6 inch step  x 20 B with one UE support S/L R/L hip flexion (SLR) 2x 10 Supine hip ABD x 20 B  Supine SLR R:  1-2 inch 3 sec hold  reps to fatigue  L: 8 inch 5 sec hold x 10 S/L R/L hip ABD PT assist for positioning for glut med 5 sec hold 2 x 10 Standing hip extension at 45 deg at stairs x 20 B  Neuromuscular Re-education: Plank at mat table 30 sec hold x2  Therapeutic activities:  Bridge 5 sec hold 2x10   PATIENT EDUCATION:  Education details: HEP Update - pt using App now Person educated: Patient Education method: Programmer, multimedia, Demonstration, Actor cues, Verbal cues, and Handouts Education comprehension: verbalized understanding, returned demonstration, verbal cues required, tactile cues required, and needs further education  HOME EXERCISE PROGRAM: Access Code: [email or web address] URL: [email or web address] Date: 08/28/2023 Prepared by: Ghazi Rumpf  Exercises - Prone Press Up  - 2 x daily - 7 x weekly - 1 sets - 10 reps - 2-3 sec  hold - Prone Press Up On Elbows  - 2 x daily - 7 x weekly - 1 sets - 3 reps - 30 sec  hold - Standing Back Extension  - 2 x daily - 7 x weekly - 1 sets - 3 reps - 2-3 sec  hold - Seated Hip Flexor Stretch  - 2 x daily - 7 x weekly - 1 sets - 3 reps - 30 sec  hold - Standing Piriformis Release with Ball at [employer]  - 2 x daily - 7 x weekly - 30-60 sec  hold - Prone Hip Extension  - 1 x daily - 3 x weekly - 2 sets - 10 reps - Seated Piriformis Stretch with Trunk Bend  - 2 x daily - 7 x weekly - 1 sets - 3 reps - 30-60 sec  hold - Supine Shoulder Flexion Extension AAROM with Dowel  - 1 x daily - 7 x weekly - 1-2 sets - 10 reps - 3 sec  hold - Beginner Bridge  - 1 x daily - 7 x weekly - 1 sets - 10 reps - 5 sec  hold - Standing Bilateral Low Shoulder Row with Anchored Resistance  - 1 x daily - 7 x weekly - 1-3 sets - 10 reps - 2-3 sec  hold - Shoulder extension with resistance - Neutral  - 1 x daily - 7 x weekly - 1-2 sets - 10 reps - 3-5 sec  hold - Anti-Rotation Lateral Stepping with Press  - 1 x daily - 7 x weekly - 1-2 sets - 10 reps - 2-3 sec  hold - Eccentric Hip Flexor Strengthening (Mirrored)  - 1 x daily - 7 x weekly - 2 sets - 10 reps - Plank with Elbows on Table  - 1 x daily - 3 x weekly - 1 sets - 5 reps - max hold -  Supine Hip Abduction  - 1 x daily - 3 x weekly - 2 sets - 10 reps - Sidelying Hip Flexion  - 1 x daily - 3 x weekly - 2 sets - 10 reps - Prone Gluteal Sets  - 2 x daily - 7 x weekly - 1 sets - 10 reps - 10 sec  hold - Prone Quadriceps Set  - 2 x daily - 7 x weekly - 1-2 sets - 10 reps - 5-10 sec  hold - Standing Hip Extension with Counter Support  - 2 x daily - 7 x weekly - 1-2 sets - 10 reps - 3-5 sec  hold - Prone Quadriceps Stretch with Strap  - 2 x daily - 7 x weekly - 1 sets - 3 reps - 30 sec  hold  ASSESSMENT:  CLINICAL IMPRESSION: Patient continues to improve with pain and  strength. She has met two of her LTGs reporting 80% improvement overall. She still has tightness and weakness in her R hip flexor. Good response again today with self MFR using ball. She is progressing appropriately and should be ready for discharge at the end of her POC.   EVAL: Patient is a 53 y.o. female who was seen today for physical therapy evaluation and treatment for recurrent LBP. E experienced initial symptoms following hysterectomy 5/24 with symptoms resolving in a few weeks. She noticed pain in the same area of LB ~ mid March which have persisted. She has also experienced edema in bilat LE's. Symptoms were better when she was on vacation for the past two weeks but recurred when she started back to work yesterday and have increased today. She has poor posture and alignment with forward flexed trunk. Patient has tightness through the hip flexors, R > L; weakness bilat LE's R > L; pain and tightness with PA mobs lumbar spine and tightness in the R > L lumbar paraspinals. She sits with weight shifted to one hip and has difficulty sitting with equal weight bearing bilat hips. She has sitting job and sedentary lifestyle sitting in a recliner to rest when home. Patient will benefit from PT to address problems identified.    GOALS: Goals reviewed with patient? Yes  SHORT TERM GOALS: Target date: 08/20/2023   Independent in initial HEP  Baseline: Goal status: MET  2.  Patient demonstrates and verbalizes correct posture and alignment in sitting  Baseline:  Goal status: met  3.  Patient reports decreased pain by 25-50%  Baseline:  Goal status: MET   LONG TERM GOALS: Target date: 09/17/2023   Decrease pain by 75-100% allowing patient to return to normal functional and work activities  Baseline:  Goal status: MET 80% 09/03/23  2.  Patient reports ability to work with no more than 1-3/10 pain level  Baseline:  Goal status: MET Just mild knot feeling, not pain. 09/03/23  3.  Increased  lumbar ROM to [facility] and pain free  Baseline:  Goal status: INITIAL  4.  Increase strength bilat LE's to 4+/5 to 5/5  Baseline:  Goal status: INITIAL  5.  Independent in HEP including aquatic program as indicated  Baseline:  Goal status: DEFERRED  6.  Improve Owestry score by 10-20 points  Baseline: 11/50; 22%  Goal status: INITIAL  PLAN:  PT FREQUENCY: 2x/week  PT DURATION: 8 weeks  PLANNED INTERVENTIONS: 97110-Therapeutic exercises, 97530- Therapeutic activity, 97112- Neuromuscular re-education, 97535- Self Care, [id number]- Manual therapy, [phone]- Aquatic Therapy, Patient/Family education, Balance training, Stair training, Taping, Dry Needling, and  Joint mobilization.  PLAN FOR NEXT SESSION: continue to focus on  R hip flexor strength and core strength, continue with TPR and mobs prn.  Omega Slager P. Geraldo Klippel PT, MPH 09/05/23 12:36 PM

## 2023-09-09 NOTE — Therapy (Signed)
 OUTPATIENT PHYSICAL THERAPY THORACOLUMBAR TREATMENT   Patient Name: Michele Roman MRN: [id number] DOB:[birth year], 53 y.o., female Today's Date: 09/09/2023  END OF SESSION:           Past Medical History:  Diagnosis Date   Anemia    Endometrial polyp 02/22/2020   Family history of colon cancer 06/10/2018   Father age 15   H/O colonoscopy 03/01/2009   Menorrhagia with regular cycle 02/22/2020   Treated with endometrial ablation January 2022   Past Surgical History:  Procedure Laterality Date   APPENDECTOMY  1997   CERVICAL POLYPECTOMY  04/2020   CESAREAN SECTION  1999   COLONOSCOPY  2012   normal per pt   DILATION AND CURETTAGE, DIAGNOSTIC / THERAPEUTIC  04/2020   EXPLORATORY LAPAROTOMY  1999   Right foot surgery  2008, 2011   hardware in the foot   UTERINE ABLATION  04/2020   Patient Active Problem List   Diagnosis Date Noted   S/P laparoscopic hysterectomy 08/16/2022   Iron  deficiency anemia due to chronic blood loss 03/04/2020   Morbid obesity (HCC) 02/22/2020   Family history of colon cancer 06/10/2018   Anemia, iron  deficiency 06/25/2013   Fatty liver 04/15/2009    PCP: Cherre Cornish, NP  REFERRING PROVIDER: Cherre Cornish, NP  REFERRING DIAG: Chronic midline LBP  Rationale for Evaluation and Treatment: Rehabilitation  THERAPY DIAG:  No diagnosis found.  ONSET DATE: 06/15/23  SUBJECTIVE:                                                                                                                                                                                           SUBJECTIVE STATEMENT: ***  EVAL:  Patient reports that she noticed LBP the day after hysterectomy last year. She treated pain with heat which was intermittent. She noticed pain and spider like appearance in the R LB area over the past 5 weeks or so. She had xrays which showed some degenerative changes. Feels the symptoms are associated with sitting in chair at work. She was on vacation  for the past two weeks and had no discomfort. She returned to work yesterday and pain started again and is worse today. 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 Yes: NPRS scale: 0/10 Pain location: center of LB Pain description: knot Aggravating factors: sitting at work  Relieving factors: not working and she is OK sitting at home;  recliner   PRECAUTIONS: None   WEIGHT BEARING RESTRICTIONS: No  FALLS:  Has patient fallen in last 6 months? 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  DIAGNOSTIC FINDINGS:  Xray - 07/02/23: Leftward curvature lumbar spine. Preservation of the vertebral body heights. Degenerative disc disease most pronounced L1-2 and L5-S1. Stool throughout the colon. SI joints are unremarkable.   IMPRESSION: Degenerative disc disease most pronounced L1-2 and L5-S1.  PATIENT SURVEYS:  Oswestry 11/50; 22%     SENSATION: WFL  MUSCLE LENGTH: Hamstrings: Right 75 deg; Left 75 deg Thomas test: Right -10-20 deg; Left -10-20 deg  POSTURE: rounded shoulders, forward head, flexed trunk , and LE's in ER   PALPATION: Tightness bilat hip flexors L > R; lumbar paraspinals R > L  Pain with PA mobs sacrum; lower lumbar    LUMBAR ROM:   AROM eval  Flexion 30% pain   Extension 20% pain   Right lateral flexion  60% pull L  Left lateral flexion 55% pull R   Right rotation 20%   Left rotation 20%   (Blank rows = not tested)  LOWER EXTREMITY ROM:     Active  Right eval Left eval  Hip flexion Tight  Tight   Hip extension Tight  Tight   Hip abduction    Hip adduction    Hip internal rotation Tight   Hip external rotation    Knee flexion    Knee extension    Ankle dorsiflexion    Ankle plantarflexion    Ankle inversion    Ankle eversion     (Blank rows = not tested)  LOWER EXTREMITY MMT:    MMT Right eval Left eval Right 5/22 Left 5/22  Hip flexion 4- 4 4+ 5  Hip extension 4- 4 5 5   Hip abduction 4- 4 4+ 5  Hip adduction   4+ 4+  Hip internal rotation      Hip external rotation      Knee flexion 5 5    Knee extension 5 5    Ankle dorsiflexion      Ankle plantarflexion      Ankle inversion      Ankle eversion       (Blank rows = not tested)  LUMBAR SPECIAL TESTS:  Straight leg raise test: Negative and Slump test: Negative  FUNCTIONAL TESTS:  5 times sit to stand: 20.81 use of UE's "tension in the center of LB'  GAIT: Distance walked: 40 feet Assistive device utilized: None Level of assistance: Complete Independence Comments: LE's in ER; trunk flexed forward at hips . TREATMENT DATE: 09/10/23 Therapeutic exercises: Nustep L7 x 10 min  SLS small range 3 sec x 20 R/L  Single leg hip flexion step up on 6 inch step  x 20 B with one UE support Standing hip extension at 45 deg with red TB at ankles x 20 B Split stance B shoulder extension from high anchor for core BTB 2 x 10 x 2 sets  Paloff press blue TB x 20 R/L  Paloff press with shoulder  flexion/extension in extended position Blue TB x 10 R/L   Therapeutic activities:  Prone quad stretch with strap x 30 sec x 2 R/L Myofacial ball release ball in R psoas muscle pt prone gentle rocking; knee flexion by PT total time ~ 3-4 min   TREATMENT DATE: 09/05/23 Therapeutic exercises: Nustep L7 x 10 min  SLS small range 3  sec x 20 R/L  Single leg hip flexion step up on 6 inch step  x 20 B with one UE support Standing hip extension at 45 deg with red TB at ankles x 20 B Split stance B shoulder extension from high anchor for core BTB 2 x 10 x 2 sets  Paloff press blue TB x 20 R/L  Paloff press with shoulder flexion/extension in extended position Blue TB x 10 R/L   Therapeutic activities:  Prone quad stretch with strap x 30 sec x 2 R/L Myofacial ball release ball in R psoas muscle pt prone gentle rocking; knee flexion by PT total time ~ 3-4 min    TREATMENT DATE: 09/03/23 Therapeutic exercises: Nustep L6 x 7 min  Single leg hip flexion step up on 6 inch step  x 20 B with one UE support Standing hip extension at 45 deg with red TB at ankles x 20 B Split stance B shoulder extension from high anchor for core BTB 2 x 10  Paloff press blue TB x 10 R/L  Paloff press with shoulder flexion/extension in extended position Blue TB x 10 R/L  Seated with ball behind back hip flexion 90 deg and up x 20 B  Therapeutic activities:  Mountain climber in plank on mat table ~ 20 reps Prone quad set toe resting on table 5 sec x 10  Prone quad stretch with strap x 30 sec x 2 R/L Myofacial ball release ball in R psoas muscle pt prone gentle rocking; knee flexion by PT total time ~ 3-4 min   TREATMENT DATE: 08/28/23 Therapeutic exercises: Nustep L6 x 6 min  Single leg hip flexion step up on 6 inch step  x 20 B with one UE support Standing hip extension at 45 deg with red TB at ankles x 20 B Split stance B shoulder extension from high anchor for core BTB 2 x 10  Paloff press blue TB x 10 R/L  Paloff press with shoulder flexion/extension in extended position Blue TB x 10 R/L  Seated with ball behind back hip flexion 90 deg and up x 20 B Supine SLR R:  x 10 x 1 set; x 5 x 1 set Bridging 5 sec x 10   Therapeutic activities:  Mountain climber in plank on counter ~ 20 reps R/L  Prone glut set 5 sec x 10 Prone quad set toe  resting on table 5 sec x 10  Prone quad stretch with PT assist 15 sec x 2 Prone quad stretch with strap x 30 sec x 2 R/L Myofacial ball release ball in R psoas muscle pt prone gentle rocking; knee flexion by PT total time ~ 3-4 min    TREATMENT DATE: 08/22/23 Therapeutic exercises: Nustep L6 x 5 min  Single leg hip flexion step up on 6 inch step  x 20 B with one UE support Standing hip extension at 45 deg with red TB at ankles x 20 B Split stance B shoulder extension from high anchor for core BTB 2 x 10  Seated with ball behind back hip flexion 90 deg and up x 20 B  Supine SLR R:  x 10 - increased range today.  Therapeutic activities:  Resisted walking 20# double handle held at waist x 10 Chops from high to low 10# R/L x 20 ea  PATIENT EDUCATION:  Education details: HEP Update - pt using App now Person educated: Patient Education method: Explanation, Demonstration, Tactile cues, Verbal cues, and Handouts Education comprehension: verbalized understanding, returned demonstration, verbal cues required, tactile cues required, and needs further education  HOME EXERCISE PROGRAM: Access Code: [email or web address] URL: [email or web address] Date: 08/28/2023 Prepared by: Celyn Holt  Exercises - Prone Press Up  - 2 x daily - 7 x weekly - 1 sets - 10 reps - 2-3 sec  hold - Prone Press Up On Elbows  - 2 x daily - 7 x weekly - 1 sets - 3 reps - 30 sec  hold - Standing Back Extension  - 2 x daily - 7 x weekly - 1 sets - 3 reps - 2-3 sec  hold - Seated Hip Flexor Stretch  - 2 x daily - 7 x weekly - 1 sets - 3 reps - 30 sec  hold - Standing Piriformis Release with Ball at [employer]  - 2 x daily - 7 x weekly - 30-60 sec  hold - Prone Hip Extension  - 1 x daily - 3 x weekly - 2 sets - 10 reps - Seated Piriformis Stretch with Trunk Bend  - 2 x daily - 7 x weekly - 1 sets - 3 reps - 30-60 sec  hold - Supine Shoulder Flexion Extension AAROM with Dowel  - 1 x daily - 7 x weekly - 1-2 sets - 10 reps - 3  sec  hold - Beginner Bridge  - 1 x daily - 7 x weekly - 1 sets - 10 reps - 5 sec  hold - Standing Bilateral Low Shoulder Row with Anchored Resistance  - 1 x daily - 7 x weekly - 1-3 sets - 10 reps - 2-3 sec  hold - Shoulder extension with resistance - Neutral  - 1 x daily - 7 x weekly - 1-2 sets - 10 reps - 3-5 sec  hold - Anti-Rotation Lateral Stepping with Press  - 1 x daily - 7 x weekly - 1-2 sets - 10 reps - 2-3 sec  hold - Eccentric Hip Flexor Strengthening (Mirrored)  - 1 x daily - 7 x weekly - 2 sets - 10 reps - Plank with Elbows on Table  - 1 x daily - 3 x weekly - 1 sets - 5 reps - max hold - Supine Hip Abduction  - 1 x daily - 3 x weekly - 2 sets - 10 reps - Sidelying Hip Flexion  - 1 x daily - 3 x weekly - 2 sets - 10 reps - Prone Gluteal Sets  - 2 x daily - 7 x weekly - 1 sets - 10 reps - 10 sec  hold - Prone Quadriceps Set  - 2 x daily - 7 x weekly - 1-2 sets - 10 reps - 5-10 sec  hold - Standing Hip Extension with Counter Support  - 2 x daily - 7 x weekly - 1-2 sets - 10 reps - 3-5 sec  hold - Prone Quadriceps Stretch with Strap  - 2 x daily - 7 x weekly - 1 sets - 3 reps - 30 sec  hold  ASSESSMENT:  CLINICAL IMPRESSION: ***   EVAL: Patient is a 53 y.o. female who was seen today for physical therapy  evaluation and treatment for recurrent LBP. E experienced initial symptoms following hysterectomy 5/24 with symptoms resolving in a few weeks. She noticed pain in the same area of LB ~ mid March which have persisted. She has also experienced edema in bilat LE's. Symptoms were better when she was on vacation for the past two weeks but recurred when she started back to work yesterday and have increased today. She has poor posture and alignment with forward flexed trunk. Patient has tightness through the hip flexors, R > L; weakness bilat LE's R > L; pain and tightness with PA mobs lumbar spine and tightness in the R > L lumbar paraspinals. She sits with weight shifted to one hip and has  difficulty sitting with equal weight bearing bilat hips. She has sitting job and sedentary lifestyle sitting in a recliner to rest when home. Patient will benefit from PT to address problems identified.    GOALS: Goals reviewed with patient? Yes  SHORT TERM GOALS: Target date: 08/20/2023   Independent in initial HEP  Baseline: Goal status: MET  2.  Patient demonstrates and verbalizes correct posture and alignment in sitting  Baseline:  Goal status: met  3.  Patient reports decreased pain by 25-50%  Baseline:  Goal status: MET   LONG TERM GOALS: Target date: 09/17/2023   Decrease pain by 75-100% allowing patient to return to normal functional and work activities  Baseline:  Goal status: MET 80% 09/03/23  2.  Patient reports ability to work with no more than 1-3/10 pain level  Baseline:  Goal status: MET Just mild knot feeling, not pain. 09/03/23  3.  Increased lumbar ROM to [facility] and pain free  Baseline:  Goal status: INITIAL  4.  Increase strength bilat LE's to 4+/5 to 5/5  Baseline:  Goal status: INITIAL  5.  Independent in HEP including aquatic program as indicated  Baseline:  Goal status: DEFERRED  6.  Improve Owestry score by 10-20 points  Baseline: 11/50; 22%  Goal status: INITIAL  PLAN:  PT FREQUENCY: 2x/week  PT DURATION: 8 weeks  PLANNED INTERVENTIONS: 97110-Therapeutic exercises, 97530- Therapeutic activity, 97112- Neuromuscular re-education, 97535- Self Care, [id number]- Manual therapy, [phone]- Aquatic Therapy, Patient/Family education, Balance training, Stair training, Taping, Dry Needling, and Joint mobilization.  PLAN FOR NEXT SESSION: continue to focus on  R hip flexor strength and core strength, continue with TPR and mobs prn.  Jinx Mourning, PT  09/09/23 8:32 PM

## 2023-09-10 ENCOUNTER — Ambulatory Visit: Payer: Self-pay | Admitting: Physical Therapy

## 2023-09-10 ENCOUNTER — Encounter: Payer: Self-pay | Admitting: Physical Therapy

## 2023-09-10 DIAGNOSIS — M5459 Other low back pain: Secondary | ICD-10-CM | POA: Diagnosis not present

## 2023-09-10 DIAGNOSIS — R29898 Other symptoms and signs involving the musculoskeletal system: Secondary | ICD-10-CM

## 2023-09-10 DIAGNOSIS — M6281 Muscle weakness (generalized): Secondary | ICD-10-CM

## 2023-09-11 NOTE — Therapy (Signed)
 OUTPATIENT PHYSICAL THERAPY THORACOLUMBAR TREATMENT   Patient Name: Michele Roman MRN: [id number] DOB:09/01/1970, 53 y.o., female Today's Date: 09/12/2023  END OF SESSION:  PT End of Session - 09/12/23 1152     Visit Number 14    Number of Visits 16    Date for PT Re-Evaluation 09/17/23    Authorization Type aetna copay none    Authorization Time Period year    Authorization - Visit Number 14    Authorization - Number of Visits 40    PT Start Time 1148    PT Stop Time 1230    PT Time Calculation (min) 42 min    Activity Tolerance Patient tolerated treatment well    Behavior During Therapy [facility] for tasks assessed/performed                   Past Medical History:  Diagnosis Date   Anemia    Endometrial polyp 02/22/2020   Family history of colon cancer 06/10/2018   Father age 46   H/O colonoscopy 03/01/2009   Menorrhagia with regular cycle 02/22/2020   Treated with endometrial ablation January 2022   Past Surgical History:  Procedure Laterality Date   APPENDECTOMY  1997   CERVICAL POLYPECTOMY  04/2020   CESAREAN SECTION  1999   COLONOSCOPY  2012   normal per pt   DILATION AND CURETTAGE, DIAGNOSTIC / THERAPEUTIC  04/2020   EXPLORATORY LAPAROTOMY  1999   Right foot surgery  2008, 2011   hardware in the foot   UTERINE ABLATION  04/2020   Patient Active Problem List   Diagnosis Date Noted   S/P laparoscopic hysterectomy 08/16/2022   Iron  deficiency anemia due to chronic blood loss 03/04/2020   Morbid obesity (HCC) 02/22/2020   Family history of colon cancer 06/10/2018   Anemia, iron  deficiency 06/25/2013   Fatty liver 04/15/2009    PCP: Cherre Cornish, NP  REFERRING PROVIDER: Cherre Cornish, NP  REFERRING DIAG: Chronic midline LBP  Rationale for Evaluation and Treatment: Rehabilitation  THERAPY DIAG:  Other low back pain  Other symptoms and signs involving the musculoskeletal system  Muscle weakness (generalized)  ONSET DATE: 06/15/23  SUBJECTIVE:                                                                                                                                                                                            SUBJECTIVE STATEMENT: No reports of pain or soreness  EVAL:  Patient reports that she noticed LBP the day after hysterectomy last year. She treated pain with heat which was intermittent. She noticed pain and spider like appearance in the  R LB area over the past 5 weeks or so. She had xrays which showed some degenerative changes. Feels the symptoms are associated with sitting in chair at work. She was on vacation for the past two weeks and had no discomfort. She returned to work yesterday and pain started again and is worse today. 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 Yes: NPRS scale: 0/10 Pain location: center of LB Pain description: knot Aggravating factors: sitting at work  Relieving factors: not working and she is OK sitting at home; recliner   PRECAUTIONS: None   WEIGHT BEARING RESTRICTIONS: No  FALLS:  Has patient fallen in last 6 months? 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  DIAGNOSTIC FINDINGS:  Xray - 07/02/23: Leftward curvature lumbar spine. Preservation of the vertebral body heights. Degenerative disc disease most pronounced  L1-2 and L5-S1. Stool throughout the colon. SI joints are unremarkable.   IMPRESSION: Degenerative disc disease most pronounced L1-2 and L5-S1.  PATIENT SURVEYS:  Oswestry 11/50; 22%     SENSATION: WFL  MUSCLE LENGTH: Hamstrings: Right 75 deg; Left 75 deg Thomas test: Right -10-20 deg; Left -10-20 deg  POSTURE: rounded shoulders, forward head, flexed trunk , and LE's in ER   PALPATION: Tightness bilat hip flexors L > R; lumbar paraspinals R > L  Pain with PA mobs sacrum; lower lumbar    LUMBAR ROM:   AROM eval 09/10/23  Flexion 30% pain  Full  Extension 20% pain  Full *  Right lateral flexion 60% pull L WNL  Left lateral flexion 55% pull R  WNL  Right rotation 20%  75%  Left rotation 20% 90%   (Blank rows = not tested)  * limited on R side with extension  LOWER EXTREMITY ROM:     Active  Right eval Left eval  Hip flexion Tight  Tight   Hip extension Tight  Tight   Hip abduction    Hip adduction    Hip internal rotation Tight   Hip external rotation    Knee flexion    Knee extension    Ankle dorsiflexion    Ankle plantarflexion    Ankle inversion    Ankle eversion     (Blank rows = not tested)  LOWER EXTREMITY MMT:    MMT Right eval Left eval Right 5/22 Left 5/22  Hip flexion 4- 4 4+ 5  Hip extension 4- 4 5 5   Hip abduction 4- 4 4+ 5  Hip adduction   4+ 4+  Hip internal rotation      Hip external rotation      Knee flexion 5 5    Knee extension 5 5    Ankle dorsiflexion      Ankle plantarflexion      Ankle inversion      Ankle eversion       (  Blank rows = not tested)  LUMBAR SPECIAL TESTS:  Straight leg raise test: Negative and Slump test: Negative  FUNCTIONAL TESTS:  5 times sit to stand: 20.81 use of UE's tension in the center of LB'  GAIT: Distance walked: 40 feet Assistive device utilized: None Level of assistance: Complete Independence Comments: LE's in ER; trunk flexed forward at hips .  TREATMENT DATE: 09/12/23 Manual: UPA  mobs R lumbar particularly at L4/5  Therapeutic exercises: Nustep L7 x 6 min  Prone quad stretch x 1 min B Supine hip flexion with 1.5# wt on ankle (thomas position) 2 sets to fatigue; first set with pressure through psoas by PT Isometric hip flexion into courageous ball 5 x 10 sec - (not difficult) Seated hip flexion 1.5# with eccentric lowering x 10 - some fatigue - advised increased weight for home Standing hip flexion with green TB around feet 2x10 B at stairs BEST (given for home) SLS small range 3 sec x 20 R/L  Paloff press black TB x 20 R/L (black issued for HEP)   TREATMENT DATE: 09/10/23 Manual: UPA mobs R lumbar particularly at L4/5  Therapeutic exercises: Nustep L7 x 6 min  Prone quad stretch 3x30 sec  Standing lumbar ext with self overpressure at L4 x 10 (after manual) SLS small range 3 sec x 20 R/L  Single leg hip flexion step up on 6 inch step with 2# AW x 20 B with  UE support Split stance B shoulder extension from high anchor for core BTB 2 x 10   Paloff press blue TB x 20 R/L  Paloff press with shoulder flexion/extension in extended position Blue TB x 10 R/L    TREATMENT DATE: 09/05/23 Therapeutic exercises: Nustep L7 x 10 min  SLS small range 3 sec x 20 R/L  Single leg hip flexion step up on 6 inch step  x 20 B with one UE support Standing hip extension at 45 deg with red TB at ankles x 20 B Split stance B shoulder extension from high anchor for core BTB 2 x 10 x 2 sets  Paloff press blue TB x 20 R/L  Paloff press with shoulder flexion/extension in extended position Blue TB x 10 R/L   Therapeutic activities:  Prone quad stretch with strap x 30 sec x 2 R/L Myofacial ball release ball in R psoas muscle pt prone gentle rocking; knee flexion by PT total time ~ 3-4 min    TREATMENT DATE: 09/03/23 Therapeutic exercises: Nustep L6 x 7 min  Single leg hip flexion step up on 6 inch step  x 20 B with one UE support Standing hip extension at 45 deg with red TB at  ankles x 20 B Split stance B shoulder extension from high anchor for core BTB 2 x 10  Paloff press blue TB x 10 R/L  Paloff press with shoulder flexion/extension in extended position Blue TB x 10 R/L  Seated with ball behind back hip flexion 90 deg and up x 20 B  Therapeutic activities:  Mountain climber in plank on mat table ~ 20 reps Prone quad set toe resting on table 5 sec x 10  Prone quad stretch with strap x 30 sec x 2 R/L Myofacial ball release ball in R psoas muscle pt prone gentle rocking; knee flexion by PT total time ~ 3-4 min   PATIENT EDUCATION:  Education details: HEP Update - pt using App now Person educated: Patient Education method: Explanation, Demonstration, Tactile cues, Verbal cues, and  Handouts Education comprehension: verbalized understanding, returned demonstration, verbal cues required, tactile cues required, and needs further education  HOME EXERCISE PROGRAM: Access Code: [email or web address] URL: [email or web address] Date: 08/28/2023 Prepared by: Celyn Holt  Exercises - Prone Press Up  - 2 x daily - 7 x weekly - 1 sets - 10 reps - 2-3 sec  hold - Prone Press Up On Elbows  - 2 x daily - 7 x weekly - 1 sets - 3 reps - 30 sec  hold - Standing Back Extension  - 2 x daily - 7 x weekly - 1 sets - 3 reps - 2-3 sec  hold - Seated Hip Flexor Stretch  - 2 x daily - 7 x weekly - 1 sets - 3 reps - 30 sec  hold - Standing Piriformis Release with Ball at [employer]  - 2 x daily - 7 x weekly - 30-60 sec  hold - Prone Hip Extension  - 1 x daily - 3 x weekly - 2 sets - 10 reps - Seated Piriformis Stretch with Trunk Bend  - 2 x daily - 7 x weekly - 1 sets - 3 reps - 30-60 sec  hold - Supine Shoulder Flexion Extension AAROM with Dowel  - 1 x daily - 7 x weekly - 1-2 sets - 10 reps - 3 sec  hold - Beginner Bridge  - 1 x daily - 7 x weekly - 1 sets - 10 reps - 5 sec  hold - Standing Bilateral Low Shoulder Row with Anchored Resistance  - 1 x daily - 7 x weekly - 1-3 sets - 10 reps  - 2-3 sec  hold - Shoulder extension with resistance - Neutral  - 1 x daily - 7 x weekly - 1-2 sets - 10 reps - 3-5 sec  hold - Anti-Rotation Lateral Stepping with Press  - 1 x daily - 7 x weekly - 1-2 sets - 10 reps - 2-3 sec  hold - Eccentric Hip Flexor Strengthening (Mirrored)  - 1 x daily - 7 x weekly - 2 sets - 10 reps - Plank with Elbows on Table  - 1 x daily - 3 x weekly - 1 sets - 5 reps - max hold - Supine Hip Abduction  - 1 x daily - 3 x weekly - 2 sets - 10 reps - Sidelying Hip Flexion  - 1 x daily - 3 x weekly - 2 sets - 10 reps - Prone Gluteal Sets  - 2 x daily - 7 x weekly - 1 sets - 10 reps - 10 sec  hold - Prone Quadriceps Set  - 2 x daily - 7 x weekly - 1-2 sets - 10 reps - 5-10 sec  hold - Standing Hip Extension with Counter Support  - 2 x daily - 7 x weekly - 1-2 sets - 10 reps - 3-5 sec  hold - Prone Quadriceps Stretch with Strap  - 2 x daily - 7 x weekly - 1 sets - 3 reps - 30 sec  hold  ASSESSMENT:  CLINICAL IMPRESSION: We focused on hip flexor strengthening today because pt is still very weak. Modified Thomas eccentric lowering is still very difficult. Added standing hip flexion with band to HEP. Patient is doing very well overall and should be ready for d/c next visit.    EVAL: Patient is a 53 y.o. female who was seen today for physical therapy evaluation and treatment for recurrent LBP. E experienced initial symptoms following hysterectomy 5/24 with symptoms resolving  in a few weeks. She noticed pain in the same area of LB ~ mid March which have persisted. She has also experienced edema in bilat LE's. Symptoms were better when she was on vacation for the past two weeks but recurred when she started back to work yesterday and have increased today. She has poor posture and alignment with forward flexed trunk. Patient has tightness through the hip flexors, R > L; weakness bilat LE's R > L; pain and tightness with PA mobs lumbar spine and tightness in the R > L lumbar  paraspinals. She sits with weight shifted to one hip and has difficulty sitting with equal weight bearing bilat hips. She has sitting job and sedentary lifestyle sitting in a recliner to rest when home. Patient will benefit from PT to address problems identified.    GOALS: Goals reviewed with patient? Yes  SHORT TERM GOALS: Target date: 08/20/2023   Independent in initial HEP  Baseline: Goal status: MET  2.  Patient demonstrates and verbalizes correct posture and alignment in sitting  Baseline:  Goal status: met  3.  Patient reports decreased pain by 25-50%  Baseline:  Goal status: MET   LONG TERM GOALS: Target date: 09/17/2023   Decrease pain by 75-100% allowing patient to return to normal functional and work activities  Baseline:  Goal status: MET 80% 09/03/23  2.  Patient reports ability to work with no more than 1-3/10 pain level  Baseline:  Goal status: MET Just mild knot feeling, not pain. 09/03/23  3.  Increased lumbar ROM to [facility] and pain free  Baseline:  Goal status: PARTIALLY MET except ext 09/10/23  4.  Increase strength bilat LE's to 4+/5 to 5/5  Baseline:  Goal status: INITIAL  5.  Independent in HEP including aquatic program as indicated  Baseline:  Goal status: DEFERRED  6.  Improve Owestry score by 10-20 points  Baseline: 11/50; 22%  Goal status: INITIAL  PLAN:  PT FREQUENCY: 2x/week  PT DURATION: 8 weeks  PLANNED INTERVENTIONS: 97110-Therapeutic exercises, 97530- Therapeutic activity, 97112- Neuromuscular re-education, 97535- Self Care, 69629- Manual therapy, [phone]- Aquatic Therapy, Patient/Family education, Balance training, Stair training, Taping, Dry Needling, and Joint mobilization.  PLAN FOR NEXT SESSION: check goals, D/C to HEP.   Jinx Mourning, PT  09/12/23 12:40 PM

## 2023-09-12 ENCOUNTER — Ambulatory Visit: Payer: Self-pay | Admitting: Physical Therapy

## 2023-09-12 ENCOUNTER — Encounter: Payer: Self-pay | Admitting: Physical Therapy

## 2023-09-12 DIAGNOSIS — M6281 Muscle weakness (generalized): Secondary | ICD-10-CM

## 2023-09-12 DIAGNOSIS — R29898 Other symptoms and signs involving the musculoskeletal system: Secondary | ICD-10-CM

## 2023-09-12 DIAGNOSIS — M5459 Other low back pain: Secondary | ICD-10-CM

## 2023-09-17 ENCOUNTER — Ambulatory Visit: Payer: Self-pay | Admitting: Rehabilitative and Restorative Service Providers"

## 2023-09-17 ENCOUNTER — Encounter: Payer: Self-pay | Admitting: Rehabilitative and Restorative Service Providers"

## 2023-09-17 DIAGNOSIS — R29898 Other symptoms and signs involving the musculoskeletal system: Secondary | ICD-10-CM

## 2023-09-17 DIAGNOSIS — M5459 Other low back pain: Secondary | ICD-10-CM | POA: Diagnosis not present

## 2023-09-17 DIAGNOSIS — M6281 Muscle weakness (generalized): Secondary | ICD-10-CM

## 2023-09-17 NOTE — Therapy (Signed)
 OUTPATIENT PHYSICAL THERAPY THORACOLUMBAR TREATMENT PHYSICAL THERAPY DISCHARGE SUMMARY  Visits from Start of Care: 15  Current functional level related to goals / functional outcomes: See progress note for discharge status    Remaining deficits: Discomfort with prolonged standing at desk at work    Education / Equipment: HEP   Patient agrees to discharge. Patient goals were met. Patient is being discharged due to meeting the stated rehab goals.  Owin Vignola P. Geraldo Klippel PT, MPH 09/17/23 12:38 PM   Patient Name: LORETHA URE MRN: [id number] DOB:Sep 12, 1970, 53 y.o., female Today's Date: 09/17/2023  END OF SESSION:  PT End of Session - 09/17/23 1143     Visit Number 15    Number of Visits 16    Date for PT Re-Evaluation 09/17/23    Authorization Type aetna copay none    Authorization Time Period year    Authorization - Visit Number 15    Authorization - Number of Visits 40    PT Start Time 1142    PT Stop Time 1228    PT Time Calculation (min) 46 min    Activity Tolerance Patient tolerated treatment well                   Past Medical History:  Diagnosis Date   Anemia    Endometrial polyp 02/22/2020   Family history of colon cancer 06/10/2018   Father age 96   H/O colonoscopy 03/01/2009   Menorrhagia with regular cycle 02/22/2020   Treated with endometrial ablation January 2022   Past Surgical History:  Procedure Laterality Date   APPENDECTOMY  1997   CERVICAL POLYPECTOMY  04/2020   CESAREAN SECTION  1999   COLONOSCOPY  2012   normal per pt   DILATION AND CURETTAGE, DIAGNOSTIC / THERAPEUTIC  04/2020   EXPLORATORY LAPAROTOMY  1999   Right foot surgery  2008, 2011   hardware in the foot   UTERINE ABLATION  04/2020   Patient Active Problem List   Diagnosis Date Noted   S/P laparoscopic hysterectomy 08/16/2022   Iron  deficiency anemia due to chronic blood loss 03/04/2020   Morbid obesity (HCC) 02/22/2020   Family history of colon cancer 06/10/2018    Anemia, iron  deficiency 06/25/2013   Fatty liver 04/15/2009    PCP: Cherre Cornish, NP  REFERRING PROVIDER: Cherre Cornish, NP  REFERRING DIAG: Chronic midline LBP  Rationale for Evaluation and Treatment: Rehabilitation  THERAPY DIAG:  Other low back pain  Other symptoms and signs involving the musculoskeletal system  Muscle weakness (generalized)  ONSET DATE: 06/15/23  SUBJECTIVE:  SUBJECTIVE STATEMENT: Working to transition to standing desk at work. Notices some discomfort in her LB when she is standing. No reports of pain. Confident in continuing with independent HEP.   EVAL:  Patient reports that she noticed LBP the day after hysterectomy last year. She treated pain with heat which was intermittent. She noticed pain and spider like appearance in the R LB area over the past 5 weeks or so. She had xrays which showed some degenerative changes. Feels the symptoms are associated with sitting in chair at work. She was on vacation for the past two weeks and had no discomfort. She returned to work yesterday and pain started again and is worse today. 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 Yes: NPRS scale: 0/10 Pain location: center of LB Pain description: knot Aggravating factors: sitting at work  Relieving factors: not working and she is OK sitting at home; recliner   PRECAUTIONS: None   WEIGHT BEARING RESTRICTIONS: No  FALLS:  Has patient fallen in last 6 months? No  LIVING ENVIRONMENT: Lives with: lives with their spouse Lives in: House/apartment Stairs: Yes: Internal: 12-14  steps; on right going up and External: 1 steps; none Has following equipment at home: None  OCCUPATION: desk and computer work x 9 years; household chores; driving daughter to activities; Agricultural consultant at [employer]   PATIENT GOALS: get rid of the pain   NEXT MD VISIT: none scheduled   OBJECTIVE:  Note: Objective measures were completed at Evaluation unless otherwise noted.  DIAGNOSTIC FINDINGS:  Xray - 07/02/23: Leftward curvature lumbar spine. Preservation of the vertebral body heights. Degenerative disc disease most pronounced L1-2 and L5-S1. Stool throughout the colon. SI joints are unremarkable.   IMPRESSION: Degenerative disc disease most pronounced L1-2 and L5-S1.  PATIENT SURVEYS:  Oswestry 11/50; 22%     SENSATION: WFL  MUSCLE LENGTH: Hamstrings: Right 75 deg; Left 75 deg Thomas test: Right -10-20 deg; Left -10-20 deg  POSTURE: rounded shoulders, forward head, flexed trunk , and LE's in ER   PALPATION: Tightness bilat hip flexors L > R; lumbar paraspinals R > L  Pain with PA mobs sacrum; lower lumbar    LUMBAR ROM:   AROM eval 09/10/23 09/17/23  Flexion 30% pain  Full WFL  Extension 20% pain  Full * WFL  Right lateral flexion 60% pull L WNL WFL  Left lateral flexion 55% pull R  WNL WFL  Right rotation 20%  75% 75%  Left rotation 20% 90% 90%   (Blank rows = not tested)  * limited on R side with extension  LOWER EXTREMITY ROM:     Active  Right eval Left eval  Hip flexion Tight  Tight   Hip extension Tight  Tight   Hip abduction    Hip adduction    Hip internal rotation Tight   Hip external rotation    Knee flexion    Knee extension    Ankle dorsiflexion    Ankle plantarflexion    Ankle inversion    Ankle eversion     (Blank rows = not tested)  LOWER EXTREMITY MMT:    MMT Right eval Left eval Right 5/22 Left 5/22 Right  09/17/23 Left  09/17/23  Hip flexion 4- 4 4+ 5 5- 5  Hip extension 4- 4 5 5 5 5   Hip abduction 4- 4 4+ 5 5- 5  Hip adduction    4+ 4+ 5- 5  Hip internal rotation        Hip external rotation        Knee flexion 5 5      Knee extension 5 5      Ankle dorsiflexion        Ankle plantarflexion        Ankle inversion        Ankle eversion         (Blank rows = not tested)  LUMBAR SPECIAL TESTS:  Straight leg raise test: Negative and Slump test: Negative  FUNCTIONAL TESTS:  5 times sit to stand: 20.81 use of UE's tension in the center of LB'  GAIT: Distance walked: 40 feet Assistive device utilized: None Level of assistance: Complete Independence Comments: LE's in ER; trunk flexed forward at hips .  TREATMENT DATE: 09/17/23 Manual: UPA mobs R lumbar particularly at L4/5  Therapeutic exercises: Nustep L7 x 8 min  Prone quad stretch x 1 min B Hip flexor stretch standing at door bringing R foot back through doorway stretching hip flexors 30-40 sec x 2  Supine hip flexion 3 sec x 10 x 2  Supine hip flexion with 1.5# wt on ankle (thomas position) 1 sets to fatigue Standing hip flexion with green TB around feet 2x10 B (went back to step without TB to avoid knee dropping in/foot out)  SLS small range 3 sec x 20 R/L  Paloff press black TB x 20 R/L Hip abduction red TB x 20 R/L    TREATMENT DATE: 09/12/23 Manual: UPA mobs R lumbar particularly at L4/5  Therapeutic exercises: Nustep L7 x 6 min  Prone quad stretch x 1 min B Supine hip flexion with 1.5# wt on ankle (thomas position) 2 sets to fatigue; first set with pressure through psoas by PT Isometric hip flexion into courageous ball 5 x 10 sec - (not difficult) Seated hip flexion 1.5# with eccentric lowering x 10 - some fatigue - advised increased weight for home Standing hip flexion with green TB around feet 2x10 B at stairs BEST (given for home) SLS small range 3 sec x 20 R/L  Paloff press black TB x 20 R/L (black issued for HEP)   TREATMENT DATE: 09/10/23 Manual: UPA mobs R lumbar particularly at L4/5  Therapeutic exercises: Nustep L7 x 6 min   Prone quad stretch 3x30 sec  Standing lumbar ext with self overpressure at L4 x 10 (after manual) SLS small range 3 sec x 20 R/L  Single leg hip flexion step up on 6 inch step with 2# AW x 20 B with  UE support Split stance B shoulder extension from high anchor for core BTB 2 x 10   Paloff press blue TB x 20 R/L  Paloff press with shoulder flexion/extension in extended position Blue TB x 10 R/L    TREATMENT DATE: 09/05/23 Therapeutic exercises: Nustep L7 x 10 min  SLS small range 3 sec x 20 R/L  Single leg hip flexion step up on 6 inch step  x 20 B with one UE support Standing hip extension at 45 deg with red TB at ankles x 20 B Split stance B shoulder extension from high anchor for core BTB 2 x 10 x 2 sets  Paloff press blue TB x 20 R/L  Paloff press with shoulder flexion/extension in extended position [facility]  TB x 10 R/L   Therapeutic activities:  Prone quad stretch with strap x 30 sec x 2 R/L Myofacial ball release ball in R psoas muscle pt prone gentle rocking; knee flexion by PT total time ~ 3-4 min    TREATMENT DATE: 09/03/23 Therapeutic exercises: Nustep L6 x 7 min  Single leg hip flexion step up on 6 inch step  x 20 B with one UE support Standing hip extension at 45 deg with red TB at ankles x 20 B Split stance B shoulder extension from high anchor for core BTB 2 x 10  Paloff press blue TB x 10 R/L  Paloff press with shoulder flexion/extension in extended position Blue TB x 10 R/L  Seated with ball behind back hip flexion 90 deg and up x 20 B  Therapeutic activities:  Mountain climber in plank on mat table ~ 20 reps Prone quad set toe resting on table 5 sec x 10  Prone quad stretch with strap x 30 sec x 2 R/L Myofacial ball release ball in R psoas muscle pt prone gentle rocking; knee flexion by PT total time ~ 3-4 min   PATIENT EDUCATION:  Education details: HEP Update - pt using App now Person educated: Patient Education method: Explanation, Demonstration, Tactile cues,  Verbal cues, and Handouts Education comprehension: verbalized understanding, returned demonstration, verbal cues required, tactile cues required, and needs further education  HOME EXERCISE PROGRAM: Access Code: [email or web address] URL: [email or web address] Date: 08/28/2023 Prepared by: Azaiah Licciardi  Exercises - Prone Press Up  - 2 x daily - 7 x weekly - 1 sets - 10 reps - 2-3 sec  hold - Prone Press Up On Elbows  - 2 x daily - 7 x weekly - 1 sets - 3 reps - 30 sec  hold - Standing Back Extension  - 2 x daily - 7 x weekly - 1 sets - 3 reps - 2-3 sec  hold - Seated Hip Flexor Stretch  - 2 x daily - 7 x weekly - 1 sets - 3 reps - 30 sec  hold - Standing Piriformis Release with Ball at [employer]  - 2 x daily - 7 x weekly - 30-60 sec  hold - Prone Hip Extension  - 1 x daily - 3 x weekly - 2 sets - 10 reps - Seated Piriformis Stretch with Trunk Bend  - 2 x daily - 7 x weekly - 1 sets - 3 reps - 30-60 sec  hold - Supine Shoulder Flexion Extension AAROM with Dowel  - 1 x daily - 7 x weekly - 1-2 sets - 10 reps - 3 sec  hold - Beginner Bridge  - 1 x daily - 7 x weekly - 1 sets - 10 reps - 5 sec  hold - Standing Bilateral Low Shoulder Row with Anchored Resistance  - 1 x daily - 7 x weekly - 1-3 sets - 10 reps - 2-3 sec  hold - Shoulder extension with resistance - Neutral  - 1 x daily - 7 x weekly - 1-2 sets - 10 reps - 3-5 sec  hold - Anti-Rotation Lateral Stepping with Press  - 1 x daily - 7 x weekly - 1-2 sets - 10 reps - 2-3 sec  hold - Eccentric Hip Flexor Strengthening (Mirrored)  - 1 x daily - 7 x weekly - 2 sets - 10 reps - Plank with Elbows on Table  - 1 x daily - 3 x weekly - 1 sets - 5 reps -  max hold - Supine Hip Abduction  - 1 x daily - 3 x weekly - 2 sets - 10 reps - Sidelying Hip Flexion  - 1 x daily - 3 x weekly - 2 sets - 10 reps - Prone Gluteal Sets  - 2 x daily - 7 x weekly - 1 sets - 10 reps - 10 sec  hold - Prone Quadriceps Set  - 2 x daily - 7 x weekly - 1-2 sets - 10 reps - 5-10  sec  hold - Standing Hip Extension with Counter Support  - 2 x daily - 7 x weekly - 1-2 sets - 10 reps - 3-5 sec  hold - Prone Quadriceps Stretch with Strap  - 2 x daily - 7 x weekly - 1 sets - 3 reps - 30 sec  hold  ASSESSMENT:  CLINICAL IMPRESSION: Patient has made good progress with rehab. She reports no back or hip pain but some continued discomfort in the back with transitioning to standing desk at work. She has excellent gains in core and LE strength. Goals of therapy have been accomplished. Patient is independent in HEP. She will call with any questions or concerns. D/C to independent HEP.   EVAL: Patient is a 53 y.o. female who was seen today for physical therapy evaluation and treatment for recurrent LBP. E experienced initial symptoms following hysterectomy 5/24 with symptoms resolving in a few weeks. She noticed pain in the same area of LB ~ mid March which have persisted. She has also experienced edema in bilat LE's. Symptoms were better when she was on vacation for the past two weeks but recurred when she started back to work yesterday and have increased today. She has poor posture and alignment with forward flexed trunk. Patient has tightness through the hip flexors, R > L; weakness bilat LE's R > L; pain and tightness with PA mobs lumbar spine and tightness in the R > L lumbar paraspinals. She sits with weight shifted to one hip and has difficulty sitting with equal weight bearing bilat hips. She has sitting job and sedentary lifestyle sitting in a recliner to rest when home. Patient will benefit from PT to address problems identified.    GOALS: Goals reviewed with patient? Yes  SHORT TERM GOALS: Target date: 08/20/2023   Independent in initial HEP  Baseline: Goal status: MET  2.  Patient demonstrates and verbalizes correct posture and alignment in sitting  Baseline:  Goal status: met  3.  Patient reports decreased pain by 25-50%  Baseline:  Goal status: MET   LONG TERM  GOALS: Target date: 09/17/2023   Decrease pain by 75-100% allowing patient to return to normal functional and work activities  Baseline:  Goal status: MET 80% 09/03/23  2.  Patient reports ability to work with no more than 1-3/10 pain level  Baseline:  Goal status: MET Just mild knot feeling, not pain. 09/03/23  3.  Increased lumbar ROM to [facility] and pain free  Baseline:  Goal status: PARTIALLY MET except ext 09/10/23  4.  Increase strength bilat LE's to 4+/5 to 5/5  Baseline:  Goal status: INITIAL  5.  Independent in HEP including aquatic program as indicated  Baseline:  Goal status: independent in HEP - has not done aquatic program   6.  Improve Owestry score by 10-20 points  Baseline: 11/50; 22%  Goal status: INITIAL  PLAN:  PT FREQUENCY: 2x/week  PT DURATION: 8 weeks  PLANNED INTERVENTIONS: 97110-Therapeutic exercises, 97530- Therapeutic activity, 97112-  Neuromuscular re-education, [phone]- Self Care, 60454- Manual therapy, [phone]- Aquatic Therapy, Patient/Family education, Balance training, Stair training, Taping, Dry Needling, and Joint mobilization.  PLAN FOR NEXT SESSION: check goals, D/C to HEP.   Yurika Pereda P. Geraldo Klippel PT, MPH 09/17/23 12:38 PM

## 2024-01-20 LAB — HM MAMMOGRAPHY

## 2024-01-22 ENCOUNTER — Encounter: Payer: Self-pay | Admitting: Medical-Surgical
